# Patient Record
Sex: Female | Born: 1960 | Race: White | Hispanic: No | Marital: Married | Smoking: Never smoker
Health system: Southern US, Community
[De-identification: ages and names within clinical notes are randomized; demographics above are authoritative.]

## PROBLEM LIST (undated history)

## (undated) DIAGNOSIS — I10 Essential (primary) hypertension: Secondary | ICD-10-CM

## (undated) DIAGNOSIS — E785 Hyperlipidemia, unspecified: Secondary | ICD-10-CM

## (undated) DIAGNOSIS — F419 Anxiety disorder, unspecified: Secondary | ICD-10-CM

## (undated) DIAGNOSIS — N289 Disorder of kidney and ureter, unspecified: Secondary | ICD-10-CM

## (undated) HISTORY — PX: ENDOMETRIAL ABLATION W/ NOVASURE: SUR434

---

## 1997-11-12 ENCOUNTER — Other Ambulatory Visit: Admission: RE | Admit: 1997-11-12 | Discharge: 1997-11-12 | Payer: Self-pay | Admitting: Obstetrics and Gynecology

## 1998-02-21 ENCOUNTER — Other Ambulatory Visit: Admission: RE | Admit: 1998-02-21 | Discharge: 1998-02-21 | Payer: Self-pay | Admitting: *Deleted

## 2009-10-15 ENCOUNTER — Ambulatory Visit: Payer: Self-pay | Admitting: Advanced Practice Midwife

## 2009-10-15 ENCOUNTER — Inpatient Hospital Stay (HOSPITAL_COMMUNITY): Admission: AD | Admit: 2009-10-15 | Discharge: 2009-10-15 | Payer: Self-pay | Admitting: Obstetrics and Gynecology

## 2009-11-04 ENCOUNTER — Inpatient Hospital Stay (HOSPITAL_COMMUNITY): Admission: AD | Admit: 2009-11-04 | Discharge: 2009-11-04 | Payer: Self-pay | Admitting: Obstetrics and Gynecology

## 2009-11-04 ENCOUNTER — Ambulatory Visit: Payer: Self-pay | Admitting: Nurse Practitioner

## 2010-07-19 LAB — CBC
HCT: 33.6 % — ABNORMAL LOW (ref 36.0–46.0)
Hemoglobin: 11.4 g/dL — ABNORMAL LOW (ref 12.0–15.0)
MCH: 28 pg (ref 26.0–34.0)
MCHC: 33.4 g/dL (ref 30.0–36.0)
MCV: 87.2 fL (ref 78.0–100.0)
RBC: 3.86 MIL/uL — ABNORMAL LOW (ref 3.87–5.11)
RBC: 3.94 MIL/uL (ref 3.87–5.11)
RDW: 15.8 % — ABNORMAL HIGH (ref 11.5–15.5)
RDW: 15.7 % — ABNORMAL HIGH (ref 11.5–15.5)
WBC: 9 10*3/uL (ref 4.0–10.5)
WBC: 11.1 10*3/uL — ABNORMAL HIGH (ref 4.0–10.5)

## 2010-07-19 LAB — URINALYSIS, ROUTINE W REFLEX MICROSCOPIC
Ketones, ur: NEGATIVE mg/dL
Protein, ur: NEGATIVE mg/dL

## 2010-07-19 LAB — WET PREP, GENITAL: Trich, Wet Prep: NONE SEEN

## 2010-07-19 LAB — GC/CHLAMYDIA PROBE AMP, GENITAL: Chlamydia, DNA Probe: NEGATIVE

## 2015-01-06 ENCOUNTER — Emergency Department
Admission: EM | Admit: 2015-01-06 | Discharge: 2015-01-06 | Disposition: A | Discharge status: Home / Self Care | Payer: No Typology Code available for payment source | Attending: Emergency Medicine | Admitting: Emergency Medicine

## 2015-01-06 ENCOUNTER — Emergency Department: Payer: No Typology Code available for payment source

## 2015-01-06 DIAGNOSIS — S161XXA Strain of muscle, fascia and tendon at neck level, initial encounter: Secondary | ICD-10-CM | POA: Insufficient documentation

## 2015-01-06 DIAGNOSIS — M5030 Other cervical disc degeneration, unspecified cervical region: Secondary | ICD-10-CM | POA: Insufficient documentation

## 2015-01-06 DIAGNOSIS — Z79899 Other long term (current) drug therapy: Secondary | ICD-10-CM | POA: Diagnosis not present

## 2015-01-06 DIAGNOSIS — Y92410 Unspecified street and highway as the place of occurrence of the external cause: Secondary | ICD-10-CM | POA: Insufficient documentation

## 2015-01-06 DIAGNOSIS — M545 Low back pain, unspecified: Secondary | ICD-10-CM

## 2015-01-06 DIAGNOSIS — S3992XA Unspecified injury of lower back, initial encounter: Secondary | ICD-10-CM | POA: Diagnosis not present

## 2015-01-06 DIAGNOSIS — Y9389 Activity, other specified: Secondary | ICD-10-CM | POA: Diagnosis not present

## 2015-01-06 DIAGNOSIS — M5136 Other intervertebral disc degeneration, lumbar region: Secondary | ICD-10-CM | POA: Insufficient documentation

## 2015-01-06 DIAGNOSIS — Y998 Other external cause status: Secondary | ICD-10-CM | POA: Diagnosis not present

## 2015-01-06 DIAGNOSIS — I10 Essential (primary) hypertension: Secondary | ICD-10-CM | POA: Insufficient documentation

## 2015-01-06 HISTORY — DX: Anxiety disorder, unspecified: F41.9

## 2015-01-06 HISTORY — DX: Essential (primary) hypertension: I10

## 2015-01-06 MED ORDER — CYCLOBENZAPRINE HCL 10 MG PO TABS
5.0000 mg | ORAL_TABLET | Freq: Once | ORAL | Status: AC
  Administered 2015-01-06: 5 mg via ORAL
  Filled 2015-01-06: qty 1

## 2015-01-06 MED ORDER — CYCLOBENZAPRINE HCL 10 MG PO TABS: 10.0000 mg | ORAL_TABLET | Freq: Three times a day (TID) | ORAL | Status: DC | PRN

## 2015-01-06 MED ORDER — TRAMADOL HCL 50 MG PO TABS: 50.0000 mg | ORAL_TABLET | Freq: Once | ORAL | Status: DC

## 2015-01-06 MED ORDER — TRAMADOL HCL 50 MG PO TABS: 50.0000 mg | ORAL_TABLET | Freq: Four times a day (QID) | ORAL | Status: DC | PRN

## 2015-01-06 MED ORDER — NAPROXEN 500 MG PO TABS
500.0000 mg | ORAL_TABLET | Freq: Once | ORAL | Status: AC
  Administered 2015-01-06: 500 mg via ORAL
  Filled 2015-01-06: qty 1
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
## 2015-03-10 ENCOUNTER — Encounter: Payer: Self-pay | Admitting: Emergency Medicine

## 2015-03-10 ENCOUNTER — Emergency Department
Admission: EM | Admit: 2015-03-10 | Discharge: 2015-03-10 | Disposition: A | Discharge status: Home / Self Care | Payer: No Typology Code available for payment source | Attending: Emergency Medicine | Admitting: Emergency Medicine

## 2015-03-10 DIAGNOSIS — I10 Essential (primary) hypertension: Secondary | ICD-10-CM | POA: Diagnosis not present

## 2015-03-10 DIAGNOSIS — Z79899 Other long term (current) drug therapy: Secondary | ICD-10-CM | POA: Diagnosis not present

## 2015-03-10 DIAGNOSIS — M549 Dorsalgia, unspecified: Secondary | ICD-10-CM | POA: Insufficient documentation

## 2015-03-10 DIAGNOSIS — G8921 Chronic pain due to trauma: Secondary | ICD-10-CM | POA: Insufficient documentation

## 2015-03-10 DIAGNOSIS — M542 Cervicalgia: Secondary | ICD-10-CM | POA: Insufficient documentation

## 2015-03-10 DIAGNOSIS — G8929 Other chronic pain: Secondary | ICD-10-CM

## 2015-03-10 DIAGNOSIS — Z760 Encounter for issue of repeat prescription: Secondary | ICD-10-CM | POA: Diagnosis present

## 2015-03-10 HISTORY — DX: Disorder of kidney and ureter, unspecified: N28.9

## 2015-03-10 MED ORDER — PREDNISONE 10 MG PO TABS: 10.0000 mg | ORAL_TABLET | Freq: Two times a day (BID) | ORAL | Status: DC

## 2015-03-10 MED ORDER — TRAMADOL HCL 50 MG PO TABS: 50.0000 mg | ORAL_TABLET | Freq: Two times a day (BID) | ORAL | Status: DC
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
## 2015-03-10 NOTE — ED Notes (Signed)
Pt presents requesting refill on hydrocodone/acetaminophen 5-325 mg. Last filled 02/07/15. Quantity 60.
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
## 2015-03-10 NOTE — Discharge Instructions (Signed)
Chronic Back Pain  When back pain lasts longer than 3 months, it is called chronic back pain.People with chronic back pain often go through certain periods that are more intense (flare-ups).  CAUSES Chronic back pain can be caused by wear and tear (degeneration) on different structures in your back. These structures include:  The bones of your spine (vertebrae) and the joints surrounding your spinal cord and nerve roots (facets).  The strong, fibrous tissues that connect your vertebrae (ligaments). Degeneration of these structures may result in pressure on your nerves. This can lead to constant pain. HOME CARE INSTRUCTIONS  Avoid bending, heavy lifting, prolonged sitting, and activities which make the problem worse.  Take brief periods of rest throughout the day to reduce your pain. Lying down or standing usually is better than sitting while you are resting.  Take over-the-counter or prescription medicines only as directed by your caregiver. SEEK IMMEDIATE MEDICAL CARE IF:   You have weakness or numbness in one of your legs or feet.  You have trouble controlling your bladder or bowels.  You have nausea, vomiting, abdominal pain, shortness of breath, or fainting.   This information is not intended to replace advice given to you by your health care provider. Make sure you discuss any questions you have with your health care provider.   Document Released: 05/27/2004 Document Revised: 07/12/2011 Document Reviewed: 10/07/2014 Elsevier Interactive Patient Education [employer].  Emergency care providers appreciate that many patients coming to [place] are in severe pain and we wish to address their pain in the safest, most responsible manner.  It is important to recognize, however, that the proper treatment of chronic pain differs from that of the pain of injuries and acute illnesses.  Our goal is to provider quality, safe, personalized care and we thank you for giving [place] the opportunity to  serve you.  The use of narcotics and related agents for chronic pain syndromes may lead to additional physical and psychological problems.  Nearly as many people die from prescription narcotics each year as die from car crashes.  Additionally, this risk is increased if such prescriptions are obtained from a variety of sources.  Therefore, only your primary care physician or a pain management specialist is able to safely treat such syndromes with narcotic medications long-term.  Documentation revealing such prescriptions have been sought from multiple sources may prohibit [place] from providing a refill or different narcotic medication.  Your name may be checked first through the [place].  This database is a record of controlled substance medication prescriptions that the patient has received.  This has been established by [facility] in an effort to eliminate the dangerous, and often life-threatening, practice of obtaining multiple prescriptions from different medical providers.  If you have a chronic pain syndrome (i.e. chronic headaches, recurrent back or neck pain, dental pain, abdominal or pelvic pain without a specific diagnosis, or neuropathic pain such as fibromyalgia) or recurrent visits for the same condition without an acute diagnosis, you may be treated with non-narcotics and other non-addictive medicines.  Allergic reactions or negative side effects that may be reported by a patient to such medications will not typically lead to the use of a narcotic analgesic or other controlled substance as an alternative.  Patients managing chronic pain with a personal physician should have provisions in place for breakthrough pain.  If you are in crisis, you should call your physician.  If your physician directs you to the emergency department,  please have the doctor call and speak to our attending physician concerning your care.  When patients come to the  Emergency Department (ED) with acute medical conditions in which the ED physician feels it is appropriate to prescribe narcotic or sedating pain medication, the physician will prescribe these in very limited quantities.  The amount of these medications will last only until you can see your primary care physician in his/her office.  Any patient who returns to the ED seeking refills should expect only non-narcotic pain medications.  In the event an acute medical condition exists and the emergency physician feels it is necessary that the patient be given a narcotic or sedating medication, a responsible adult driver should be present in the room prior to the medication being given by the nurse.  Prescriptions for narcotic or sedating medications that have been lost, stolen, or expired will NOT be refilled in the ED.  Patients who have chronic pain may receive non-narcotic prescriptions until seen by their primary care physician.  It is every patient's personal responsibility to maintain active prescriptions with his or her primary care physician or specialist.  ################################################################################################  You must follow-up with your primary providers for management of your chronic pain. Take the prescription meds as directed.

## 2020-06-18 ENCOUNTER — Other Ambulatory Visit: Payer: Self-pay

## 2020-06-18 ENCOUNTER — Ambulatory Visit (INDEPENDENT_AMBULATORY_CARE_PROVIDER_SITE_OTHER): Payer: 59

## 2020-06-18 ENCOUNTER — Ambulatory Visit
Admission: EM | Admit: 2020-06-18 | Discharge: 2020-06-18 | Disposition: A | Discharge status: Home / Self Care | Payer: 59

## 2020-06-18 DIAGNOSIS — M7021 Olecranon bursitis, right elbow: Secondary | ICD-10-CM

## 2020-06-18 NOTE — Discharge Instructions (Addendum)
Your x-rays today did not show the presence of any broken bones in your elbow.  Apply an Ace bandage or an elastic compression sleeve to help your body reabsorb the fluid in your elbow.  You can also apply topical Voltaren gel up to 4 times a day as needed for any discomfort.  Return for reevaluation for any new or worsening symptoms.

## 2020-06-18 NOTE — ED Provider Notes (Signed)
[facility]    CSN: [id number] Arrival date & time: 06/18/20  1608      History   Chief Complaint Chief Complaint  Patient presents with  . Elbow Pain    right    HPI Katelyn Gardner is a 60 y.o. female.   HPI   60 year old female here for evaluation of pain and swelling over her right elbow.  Patient reports that she fell about 8 days ago going up some steps.  She reports that she has had swelling right over the point of her elbow and states it feels like there is some fluid buildup.  Patient denies numbness, tingling, or weakness in her right hand and she has full range of motion of her elbow.  Past Medical History:  Diagnosis Date  . Anxiety   . Hypertension   . Renal disorder     There are no problems to display for this patient.   History reviewed. No pertinent surgical history.  OB History   No obstetric history on file.      Home Medications    Prior to Admission medications   Medication Sig Start Date End Date Taking? Authorizing Provider  atorvastatin (LIPITOR) 10 MG tablet Take 10 mg by mouth daily. 05/29/20  Yes [provider]  carvedilol (COREG) 3.125 MG tablet Take by mouth. 06/17/20  Yes [provider]  clonazePAM (KLONOPIN) 0.5 MG tablet Take by mouth. 06/18/20  Yes [provider]  cyclobenzaprine (FLEXERIL) 10 MG tablet Take 1 tablet (10 mg total) by mouth 3 (three) times daily as needed for muscle spasms. 01/06/15  Yes Triplett, Cari B, FNP  enalapril (VASOTEC) 5 MG tablet Take 5 mg by mouth daily. 05/06/20  Yes [provider]  HYDROcodone-acetaminophen (NORCO/VICODIN) 5-325 MG tablet Take by mouth. 06/03/20  Yes [provider]  propranolol (INDERAL) 10 MG tablet Take 10 mg by mouth 2 (two) times daily. 06/02/20  Yes [provider]  zolpidem (AMBIEN) 10 MG tablet Take 10 mg by mouth at bedtime as needed. 06/02/20  Yes [provider]  hydrochlorothiazide (MICROZIDE) 12.5 MG  capsule Take 12.5 mg by mouth daily.  06/18/20  [provider]    Family History History reviewed. No pertinent family history.  Social History Social History   Tobacco Use  . Smoking status: Never Smoker  . Smokeless tobacco: Never Used  Substance Use Topics  . Alcohol use: No  . Drug use: No     Allergies   Sulfa antibiotics   Review of Systems Review of Systems  Constitutional: Negative for activity change.  Musculoskeletal: Positive for arthralgias and joint swelling.  Skin: Negative for color change and rash.  Neurological: Negative for weakness and numbness.  Hematological: Negative.   Psychiatric/Behavioral: Negative.      Physical Exam Triage Vital Signs ED Triage Vitals  Enc Vitals Group     BP 06/18/20 1638 (!) 161/118     Pulse Rate 06/18/20 1638 (!) 109     Resp 06/18/20 1638 18     Temp 06/18/20 1638 98.5 F (36.9 C)     Temp Source 06/18/20 1638 Oral     SpO2 06/18/20 1638 100 %     Weight 06/18/20 1634 135 lb (61.2 kg)     Height 06/18/20 1634 5' (1.524 m)     Head Circumference --      Peak Flow --      Pain Score 06/18/20 1634 6     Pain Loc --  Pain Edu? --      Excl. in GC? --    No data found.  Updated Vital Signs BP (!) 161/118 (BP Location: Left Arm)   Pulse (!) 109   Temp 98.5 F (36.9 C) (Oral)   Resp 18   Ht 5' (1.524 m)   Wt 135 lb (61.2 kg)   SpO2 100%   BMI 26.37 kg/m   Visual Acuity Right Eye Distance:   Left Eye Distance:   Bilateral Distance:    Right Eye Near:   Left Eye Near:    Bilateral Near:     Physical Exam Vitals and nursing note reviewed.  Constitutional:      General: She is not in acute distress.    Appearance: Normal appearance. She is normal weight. She is not ill-appearing.  HENT:     Head: Normocephalic and atraumatic.  Cardiovascular:     Rate and Rhythm: Normal rate and regular rhythm.     Pulses: Normal pulses.     Heart sounds: Normal heart sounds. No murmur heard. No  gallop.   Pulmonary:     Effort: Pulmonary effort is normal.     Breath sounds: Normal breath sounds. No wheezing, rhonchi or rales.  Musculoskeletal:        General: Swelling and tenderness present. No deformity or signs of injury. Normal range of motion.  Skin:    General: Skin is warm and dry.     Capillary Refill: Capillary refill takes less than 2 seconds.     Findings: No bruising or erythema.  Neurological:     General: No focal deficit present.     Mental Status: She is alert and oriented to person, place, and time.  Psychiatric:        Mood and Affect: Mood normal.        Behavior: Behavior normal.        Thought Content: Thought content normal.        Judgment: Judgment normal.      UC Treatments / Results  Labs (all labs ordered are listed, but only abnormal results are displayed) Labs Reviewed - No data to display  EKG   Radiology DG Elbow Complete Right  Result Date: 06/18/2020 CLINICAL DATA:  Pain and swelling over the olecranon status post fall. EXAM: RIGHT ELBOW - COMPLETE 3+ VIEW COMPARISON:  None. FINDINGS: There is soft tissue swelling about the proximal ulna without evidence for an underlying acute displaced fracture or dislocation. There is no significant joint effusion. There is a tiny well corticated osseous fragment adjacent to the radial head which is likely related to an old remote injury. IMPRESSION: Soft tissue swelling about the proximal ulna without evidence for an underlying acute displaced fracture or dislocation. Electronically Signed   By: Katherine Mantle M.D.   On: 06/18/2020 17:19    Procedures Procedures (including critical care time)  Medications Ordered in UC Medications - No data to display  Initial Impression / Assessment and Plan / UC Course  I have reviewed the triage vital signs and the nursing notes.  Pertinent labs & imaging results that were available during my care of the patient were reviewed by me and considered in my  medical decision making (see chart for details).   Patient is a very pleasant 60 year old female here for evaluation of pain and swelling of her right elbow.  Physical exam reveals swelling directly over the olecranon process.  The swelling is not tender, erythematous, or hot.  Patient does  have tenderness to palpation of the tip of the olecranon process.  There is no ecchymosis present and patient has full range of motion of her right elbow.  No pain with supination or pronation of the right forearm.  Radial and ulnar pulses are 2+.  Cap refill less than 2 seconds.  Full sensation of the fingers and hand present.  Will obtain radiograph of right elbow to rule out fracture of olecranon process.  Suspect patient has a rupture of her olecranon bursa.  X-ray shows no fracture or dislocation per radiology interpretation.   Patient home with a diagnosis of olecranon bursitis and have her apply compression using Ace bandage or a sleeve, and take over-the-counter NSAID as needed for inflammation.  Final Clinical Impressions(s) / UC Diagnoses   Final diagnoses:  Olecranon bursitis of right elbow     Discharge Instructions     Your x-rays today did not show the presence of any broken bones in your elbow.  Apply an Ace bandage or an elastic compression sleeve to help your body reabsorb the fluid in your elbow.  You can also apply topical Voltaren gel up to 4 times a day as needed for any discomfort.  Return for reevaluation for any new or worsening symptoms.    ED Prescriptions    None     PDMP not reviewed this encounter.   Becky Augusta, NP 06/18/20 [phone]

## 2020-06-18 NOTE — ED Triage Notes (Signed)
Patient complains of right elbow pain and swelling. Patient states that she fell around 8 days ago bringing in groceries and fell going up steps. Patient with swelling to right elbow and states that she feels like there is a fluid build up.

## 2021-05-13 DIAGNOSIS — R69 Illness, unspecified: Secondary | ICD-10-CM | POA: Diagnosis not present

## 2021-05-13 DIAGNOSIS — F3342 Major depressive disorder, recurrent, in full remission: Secondary | ICD-10-CM | POA: Diagnosis not present

## 2021-05-13 DIAGNOSIS — F4001 Agoraphobia with panic disorder: Secondary | ICD-10-CM | POA: Diagnosis not present

## 2021-06-10 DIAGNOSIS — F410 Panic disorder [episodic paroxysmal anxiety] without agoraphobia: Secondary | ICD-10-CM | POA: Diagnosis not present

## 2021-06-10 DIAGNOSIS — R69 Illness, unspecified: Secondary | ICD-10-CM | POA: Diagnosis not present

## 2021-06-10 DIAGNOSIS — F411 Generalized anxiety disorder: Secondary | ICD-10-CM | POA: Diagnosis not present

## 2021-07-08 DIAGNOSIS — F410 Panic disorder [episodic paroxysmal anxiety] without agoraphobia: Secondary | ICD-10-CM | POA: Diagnosis not present

## 2021-07-08 DIAGNOSIS — R69 Illness, unspecified: Secondary | ICD-10-CM | POA: Diagnosis not present

## 2021-07-08 DIAGNOSIS — F411 Generalized anxiety disorder: Secondary | ICD-10-CM | POA: Diagnosis not present

## 2021-07-30 DIAGNOSIS — N1831 Chronic kidney disease, stage 3a: Secondary | ICD-10-CM | POA: Diagnosis not present

## 2021-07-30 DIAGNOSIS — R809 Proteinuria, unspecified: Secondary | ICD-10-CM | POA: Diagnosis not present

## 2021-07-30 DIAGNOSIS — N183 Chronic kidney disease, stage 3 unspecified: Secondary | ICD-10-CM | POA: Diagnosis not present

## 2021-07-30 DIAGNOSIS — I10 Essential (primary) hypertension: Secondary | ICD-10-CM | POA: Diagnosis not present

## 2021-08-06 DIAGNOSIS — F3342 Major depressive disorder, recurrent, in full remission: Secondary | ICD-10-CM | POA: Diagnosis not present

## 2021-08-06 DIAGNOSIS — F411 Generalized anxiety disorder: Secondary | ICD-10-CM | POA: Diagnosis not present

## 2021-08-06 DIAGNOSIS — R69 Illness, unspecified: Secondary | ICD-10-CM | POA: Diagnosis not present

## 2021-08-25 DIAGNOSIS — R69 Illness, unspecified: Secondary | ICD-10-CM | POA: Diagnosis not present

## 2021-08-25 DIAGNOSIS — Z79891 Long term (current) use of opiate analgesic: Secondary | ICD-10-CM | POA: Diagnosis not present

## 2021-08-25 DIAGNOSIS — E782 Mixed hyperlipidemia: Secondary | ICD-10-CM | POA: Diagnosis not present

## 2021-08-25 DIAGNOSIS — N1831 Chronic kidney disease, stage 3a: Secondary | ICD-10-CM | POA: Diagnosis not present

## 2021-08-25 DIAGNOSIS — M545 Low back pain, unspecified: Secondary | ICD-10-CM | POA: Diagnosis not present

## 2021-08-25 DIAGNOSIS — G8929 Other chronic pain: Secondary | ICD-10-CM | POA: Diagnosis not present

## 2021-08-25 DIAGNOSIS — I10 Essential (primary) hypertension: Secondary | ICD-10-CM | POA: Diagnosis not present

## 2021-08-25 DIAGNOSIS — Z79899 Other long term (current) drug therapy: Secondary | ICD-10-CM | POA: Diagnosis not present

## 2021-09-02 DIAGNOSIS — F4001 Agoraphobia with panic disorder: Secondary | ICD-10-CM | POA: Diagnosis not present

## 2021-09-02 DIAGNOSIS — R69 Illness, unspecified: Secondary | ICD-10-CM | POA: Diagnosis not present

## 2021-09-02 DIAGNOSIS — F411 Generalized anxiety disorder: Secondary | ICD-10-CM | POA: Diagnosis not present

## 2021-09-30 DIAGNOSIS — F411 Generalized anxiety disorder: Secondary | ICD-10-CM | POA: Diagnosis not present

## 2021-09-30 DIAGNOSIS — F3342 Major depressive disorder, recurrent, in full remission: Secondary | ICD-10-CM | POA: Diagnosis not present

## 2021-09-30 DIAGNOSIS — R69 Illness, unspecified: Secondary | ICD-10-CM | POA: Diagnosis not present

## 2021-10-28 DIAGNOSIS — R69 Illness, unspecified: Secondary | ICD-10-CM | POA: Diagnosis not present

## 2021-10-28 DIAGNOSIS — F411 Generalized anxiety disorder: Secondary | ICD-10-CM | POA: Diagnosis not present

## 2021-10-28 DIAGNOSIS — F4001 Agoraphobia with panic disorder: Secondary | ICD-10-CM | POA: Diagnosis not present

## 2021-11-25 DIAGNOSIS — R69 Illness, unspecified: Secondary | ICD-10-CM | POA: Diagnosis not present

## 2021-11-25 DIAGNOSIS — F3341 Major depressive disorder, recurrent, in partial remission: Secondary | ICD-10-CM | POA: Diagnosis not present

## 2021-11-25 DIAGNOSIS — F4001 Agoraphobia with panic disorder: Secondary | ICD-10-CM | POA: Diagnosis not present

## 2021-12-22 DIAGNOSIS — N183 Chronic kidney disease, stage 3 unspecified: Secondary | ICD-10-CM | POA: Diagnosis not present

## 2021-12-22 DIAGNOSIS — R809 Proteinuria, unspecified: Secondary | ICD-10-CM | POA: Diagnosis not present

## 2021-12-22 DIAGNOSIS — N1831 Chronic kidney disease, stage 3a: Secondary | ICD-10-CM | POA: Diagnosis not present

## 2021-12-22 DIAGNOSIS — I10 Essential (primary) hypertension: Secondary | ICD-10-CM | POA: Diagnosis not present

## 2022-01-19 DIAGNOSIS — F330 Major depressive disorder, recurrent, mild: Secondary | ICD-10-CM | POA: Diagnosis not present

## 2022-01-19 DIAGNOSIS — R69 Illness, unspecified: Secondary | ICD-10-CM | POA: Diagnosis not present

## 2022-01-19 DIAGNOSIS — F4001 Agoraphobia with panic disorder: Secondary | ICD-10-CM | POA: Diagnosis not present

## 2022-03-16 DIAGNOSIS — F411 Generalized anxiety disorder: Secondary | ICD-10-CM | POA: Diagnosis not present

## 2022-03-16 DIAGNOSIS — R69 Illness, unspecified: Secondary | ICD-10-CM | POA: Diagnosis not present

## 2022-03-16 DIAGNOSIS — F4001 Agoraphobia with panic disorder: Secondary | ICD-10-CM | POA: Diagnosis not present

## 2022-03-31 DIAGNOSIS — E782 Mixed hyperlipidemia: Secondary | ICD-10-CM | POA: Diagnosis not present

## 2022-03-31 DIAGNOSIS — Z79899 Other long term (current) drug therapy: Secondary | ICD-10-CM | POA: Diagnosis not present

## 2022-03-31 DIAGNOSIS — R69 Illness, unspecified: Secondary | ICD-10-CM | POA: Diagnosis not present

## 2022-03-31 DIAGNOSIS — M545 Low back pain, unspecified: Secondary | ICD-10-CM | POA: Diagnosis not present

## 2022-03-31 DIAGNOSIS — N1831 Chronic kidney disease, stage 3a: Secondary | ICD-10-CM | POA: Diagnosis not present

## 2022-03-31 DIAGNOSIS — G8929 Other chronic pain: Secondary | ICD-10-CM | POA: Diagnosis not present

## 2022-03-31 DIAGNOSIS — Z79891 Long term (current) use of opiate analgesic: Secondary | ICD-10-CM | POA: Diagnosis not present

## 2022-03-31 DIAGNOSIS — I10 Essential (primary) hypertension: Secondary | ICD-10-CM | POA: Diagnosis not present

## 2022-04-13 DIAGNOSIS — R69 Illness, unspecified: Secondary | ICD-10-CM | POA: Diagnosis not present

## 2022-04-13 DIAGNOSIS — F411 Generalized anxiety disorder: Secondary | ICD-10-CM | POA: Diagnosis not present

## 2022-04-13 DIAGNOSIS — F3342 Major depressive disorder, recurrent, in full remission: Secondary | ICD-10-CM | POA: Diagnosis not present

## 2022-04-21 DIAGNOSIS — R809 Proteinuria, unspecified: Secondary | ICD-10-CM | POA: Diagnosis not present

## 2022-04-21 DIAGNOSIS — I10 Essential (primary) hypertension: Secondary | ICD-10-CM | POA: Diagnosis not present

## 2022-04-21 DIAGNOSIS — N1831 Chronic kidney disease, stage 3a: Secondary | ICD-10-CM | POA: Diagnosis not present

## 2022-07-19 DIAGNOSIS — M170 Bilateral primary osteoarthritis of knee: Secondary | ICD-10-CM | POA: Diagnosis not present

## 2022-07-27 DIAGNOSIS — F3342 Major depressive disorder, recurrent, in full remission: Secondary | ICD-10-CM | POA: Diagnosis not present

## 2022-07-27 DIAGNOSIS — F4001 Agoraphobia with panic disorder: Secondary | ICD-10-CM | POA: Diagnosis not present

## 2022-07-27 DIAGNOSIS — F411 Generalized anxiety disorder: Secondary | ICD-10-CM | POA: Diagnosis not present

## 2022-09-21 DIAGNOSIS — F411 Generalized anxiety disorder: Secondary | ICD-10-CM | POA: Diagnosis not present

## 2022-09-21 DIAGNOSIS — F4001 Agoraphobia with panic disorder: Secondary | ICD-10-CM | POA: Diagnosis not present

## 2022-09-21 DIAGNOSIS — F3342 Major depressive disorder, recurrent, in full remission: Secondary | ICD-10-CM | POA: Diagnosis not present

## 2022-10-04 DIAGNOSIS — I10 Essential (primary) hypertension: Secondary | ICD-10-CM | POA: Diagnosis not present

## 2022-10-04 DIAGNOSIS — R809 Proteinuria, unspecified: Secondary | ICD-10-CM | POA: Diagnosis not present

## 2022-10-04 DIAGNOSIS — N1831 Chronic kidney disease, stage 3a: Secondary | ICD-10-CM | POA: Diagnosis not present

## 2022-10-04 IMAGING — CR DG ELBOW COMPLETE 3+V*R*
4 series · 4 of 4 positions shown · non-contrast
Comparison: None.

CLINICAL DATA: Pain and swelling over the olecranon status post
fall.

EXAM:
RIGHT ELBOW - COMPLETE 3+ VIEW

[elbow ap]
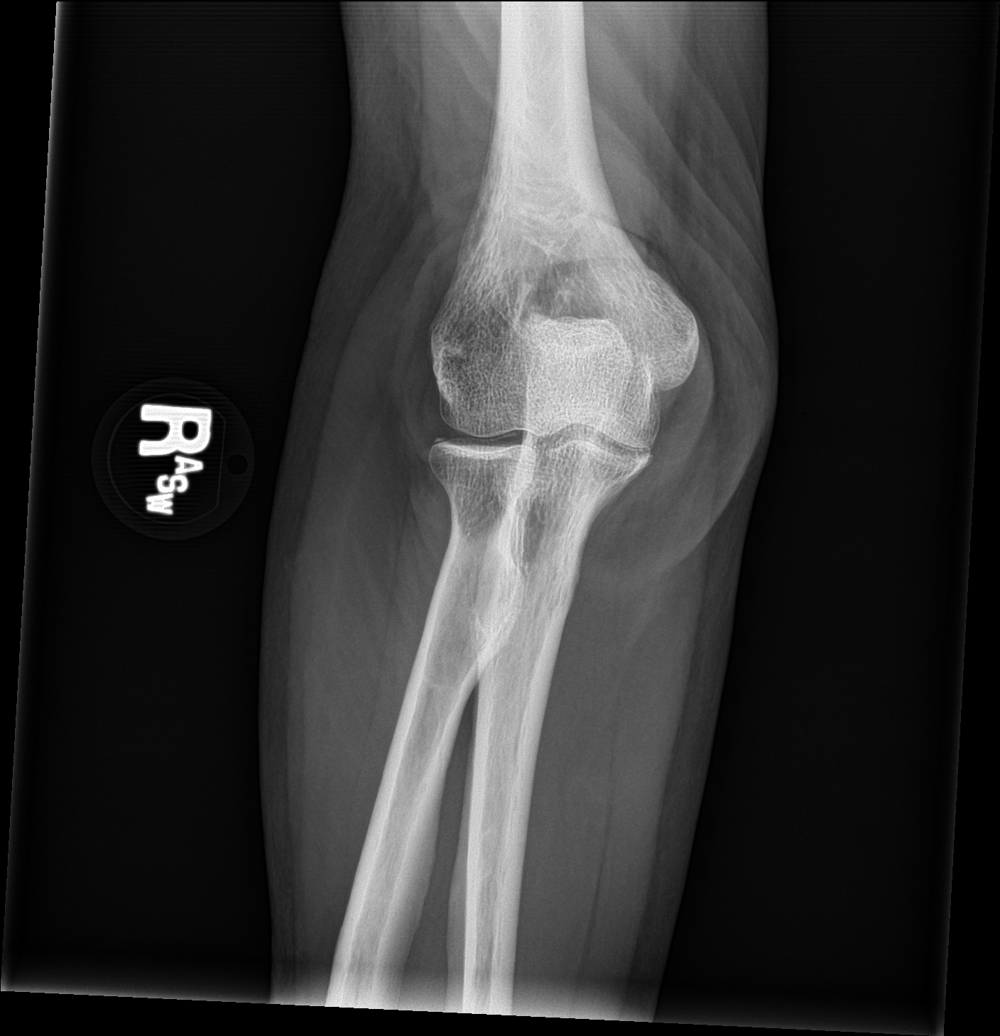

[elbow obl (1 of 2)]
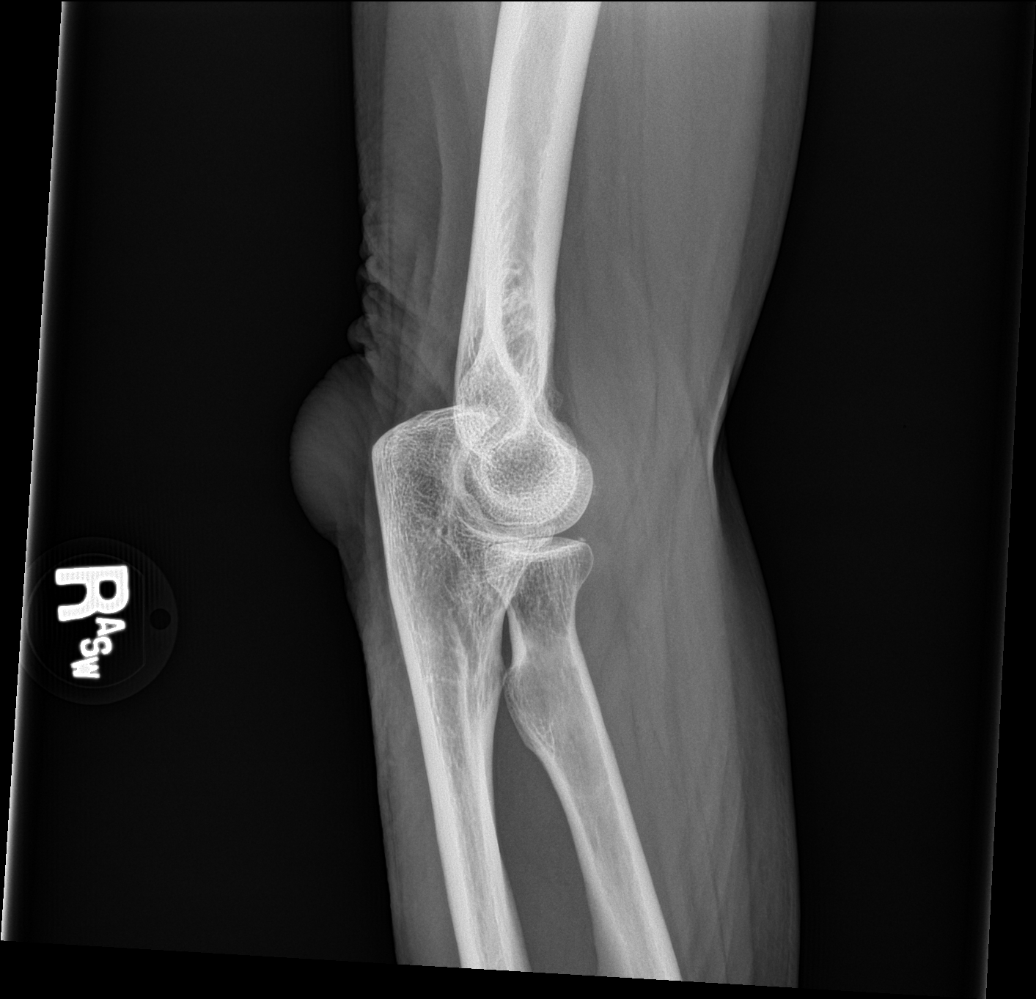

[elbow obl (2 of 2)]
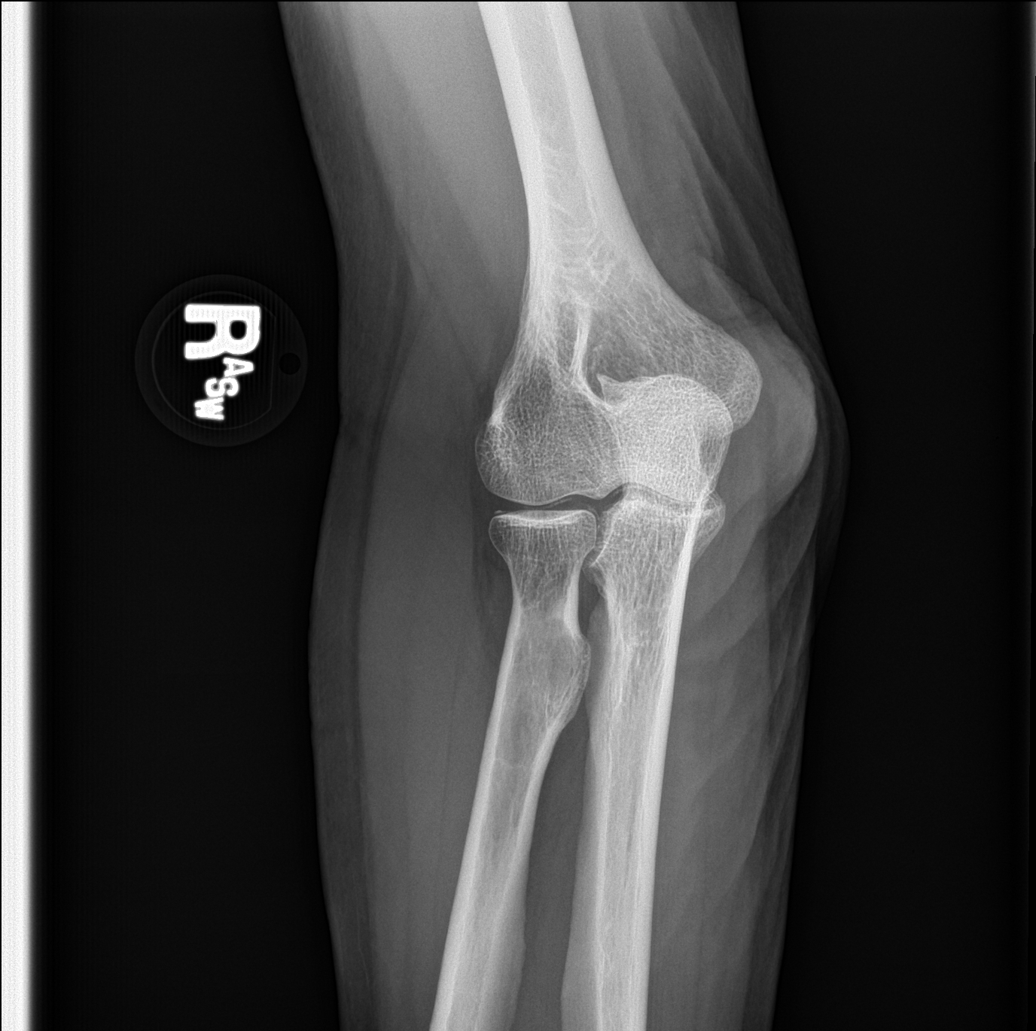

[elbow lat]
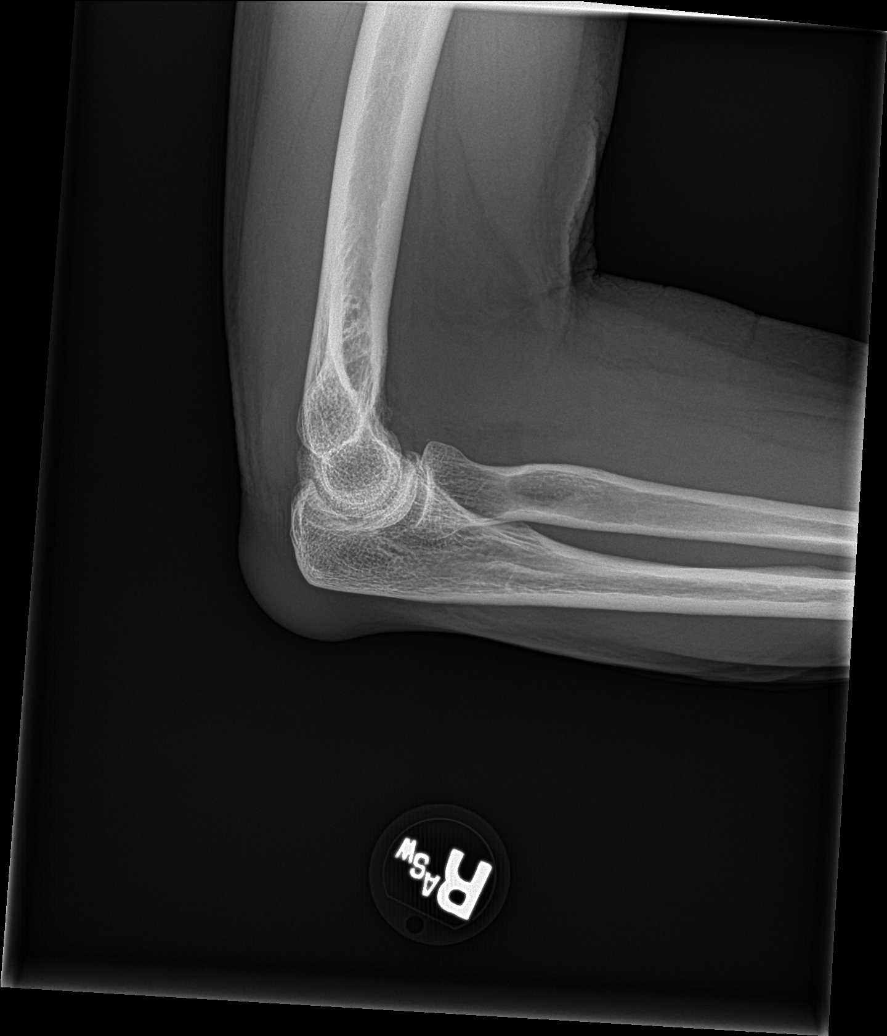

[4 of 4 positions shown; findings below may reference images not displayed]

FINDINGS: There is soft tissue swelling about the proximal ulna without
evidence for an underlying acute displaced fracture or dislocation.
There is no significant joint effusion. There is a tiny well
corticated osseous fragment adjacent to the radial head which is
likely related to an old remote injury.
IMPRESSION: Soft tissue swelling about the proximal ulna without evidence for an
underlying acute displaced fracture or dislocation.

## 2022-10-06 DIAGNOSIS — Z79899 Other long term (current) drug therapy: Secondary | ICD-10-CM | POA: Diagnosis not present

## 2022-10-06 DIAGNOSIS — Z1331 Encounter for screening for depression: Secondary | ICD-10-CM | POA: Diagnosis not present

## 2022-10-06 DIAGNOSIS — I10 Essential (primary) hypertension: Secondary | ICD-10-CM | POA: Diagnosis not present

## 2022-10-06 DIAGNOSIS — Z79891 Long term (current) use of opiate analgesic: Secondary | ICD-10-CM | POA: Diagnosis not present

## 2022-10-06 DIAGNOSIS — Z131 Encounter for screening for diabetes mellitus: Secondary | ICD-10-CM | POA: Diagnosis not present

## 2022-10-06 DIAGNOSIS — F5104 Psychophysiologic insomnia: Secondary | ICD-10-CM | POA: Diagnosis not present

## 2022-10-06 DIAGNOSIS — G8929 Other chronic pain: Secondary | ICD-10-CM | POA: Diagnosis not present

## 2022-10-06 DIAGNOSIS — E782 Mixed hyperlipidemia: Secondary | ICD-10-CM | POA: Diagnosis not present

## 2022-10-06 DIAGNOSIS — F419 Anxiety disorder, unspecified: Secondary | ICD-10-CM | POA: Diagnosis not present

## 2022-10-06 DIAGNOSIS — Z0000 Encounter for general adult medical examination without abnormal findings: Secondary | ICD-10-CM | POA: Diagnosis not present

## 2022-10-06 DIAGNOSIS — N1831 Chronic kidney disease, stage 3a: Secondary | ICD-10-CM | POA: Diagnosis not present

## 2022-10-06 DIAGNOSIS — R7302 Impaired glucose tolerance (oral): Secondary | ICD-10-CM

## 2022-10-06 DIAGNOSIS — M545 Low back pain, unspecified: Secondary | ICD-10-CM | POA: Diagnosis not present

## 2022-10-06 HISTORY — DX: Impaired glucose tolerance (oral): R73.02

## 2022-10-19 DIAGNOSIS — F3342 Major depressive disorder, recurrent, in full remission: Secondary | ICD-10-CM | POA: Diagnosis not present

## 2022-10-19 DIAGNOSIS — F4001 Agoraphobia with panic disorder: Secondary | ICD-10-CM | POA: Diagnosis not present

## 2022-10-19 DIAGNOSIS — F411 Generalized anxiety disorder: Secondary | ICD-10-CM | POA: Diagnosis not present

## 2022-10-29 ENCOUNTER — Inpatient Hospital Stay: Payer: 59 | Attending: Internal Medicine | Admitting: Internal Medicine

## 2022-10-29 ENCOUNTER — Encounter: Payer: Self-pay | Admitting: Internal Medicine

## 2022-10-29 ENCOUNTER — Inpatient Hospital Stay: Payer: 59

## 2022-10-29 VITALS — BP 142/92 | HR 87 | Temp 97.8°F | Wt 140.0 lb

## 2022-10-29 DIAGNOSIS — I129 Hypertensive chronic kidney disease with stage 1 through stage 4 chronic kidney disease, or unspecified chronic kidney disease: Secondary | ICD-10-CM | POA: Diagnosis not present

## 2022-10-29 DIAGNOSIS — F419 Anxiety disorder, unspecified: Secondary | ICD-10-CM

## 2022-10-29 DIAGNOSIS — Z79899 Other long term (current) drug therapy: Secondary | ICD-10-CM

## 2022-10-29 DIAGNOSIS — I10 Essential (primary) hypertension: Secondary | ICD-10-CM

## 2022-10-29 DIAGNOSIS — D649 Anemia, unspecified: Secondary | ICD-10-CM | POA: Diagnosis not present

## 2022-10-29 DIAGNOSIS — N1831 Chronic kidney disease, stage 3a: Secondary | ICD-10-CM

## 2022-10-29 LAB — IRON AND TIBC
Iron: 56 ug/dL (ref 28–170)
Saturation Ratios: 16 % (ref 10.4–31.8)
TIBC: 351 ug/dL (ref 250–450)
UIBC: 295 ug/dL

## 2022-10-29 LAB — CBC WITH DIFFERENTIAL/PLATELET
Abs Immature Granulocytes: 0.02 10*3/uL (ref 0.00–0.07)
Basophils Absolute: 0 10*3/uL (ref 0.0–0.1)
Basophils Relative: 1 %
Eosinophils Absolute: 0.1 10*3/uL (ref 0.0–0.5)
Eosinophils Relative: 2 %
HCT: 32.3 % — ABNORMAL LOW (ref 36.0–46.0)
Hemoglobin: 10.8 g/dL — ABNORMAL LOW (ref 12.0–15.0)
Immature Granulocytes: 0 %
Lymphocytes Relative: 28 %
Lymphs Abs: 1.7 10*3/uL (ref 0.7–4.0)
MCH: 30.9 pg (ref 26.0–34.0)
MCHC: 33.4 g/dL (ref 30.0–36.0)
MCV: 92.6 fL (ref 80.0–100.0)
Monocytes Absolute: 0.4 10*3/uL (ref 0.1–1.0)
Monocytes Relative: 6 %
Neutro Abs: 3.9 10*3/uL (ref 1.7–7.7)
Neutrophils Relative %: 63 %
Platelets: 348 10*3/uL (ref 150–400)
RBC: 3.49 MIL/uL — ABNORMAL LOW (ref 3.87–5.11)
RDW: 13.5 % (ref 11.5–15.5)
WBC: 6.2 10*3/uL (ref 4.0–10.5)
nRBC: 0 % (ref 0.0–0.2)

## 2022-10-29 LAB — FERRITIN: Ferritin: 16 ng/mL (ref 11–307)

## 2022-10-29 LAB — VITAMIN B12: Vitamin B-12: >7500 pg/mL — ABNORMAL HIGH (ref 180–914)

## 2022-10-29 LAB — FOLATE: Folate: 9.7 ng/mL (ref >5.9)

## 2022-10-29 NOTE — Progress Notes (Signed)
[facility]  Telephone:(336) [phone] Fax:(336) [phone]  ID: Katelyn Gardner OB: 1960-05-23  MR#: [id number]  AOZ#:[id number]  Patient Care Team: Patient, No Pcp Per as PCP - General (General Practice)  REFERRING PROVIDER: Dr. Cherylann Ratel  REASON FOR REFERRAL: Anemia, CKD  HPI: Katelyn Gardner is a 62 y.o. female with past medical history of anxiety, hypertension,    REVIEW OF SYSTEMS:   ROS  As per HPI. Otherwise, a complete review of systems is negative.  PAST MEDICAL HISTORY: Past Medical History:  Diagnosis Date  . Anxiety   . Hypertension   . Renal disorder     PAST SURGICAL HISTORY: No past surgical history on file.  FAMILY HISTORY: Family History  Problem Relation Age of Onset  . Lung cancer Father   . Lung cancer Brother     HEALTH MAINTENANCE: Social History   Tobacco Use  . Smoking status: Never  . Smokeless tobacco: Never  Substance Use Topics  . Alcohol use: No  . Drug use: No     Allergies  Allergen Reactions  . Sulfa Antibiotics Rash    Current Outpatient Medications  Medication Sig Dispense Refill  . atorvastatin (LIPITOR) 10 MG tablet Take 10 mg by mouth daily.    . butalbital-aspirin-caffeine-codeine (FIORINAL WITH CODEINE) 50-325-40-30 MG capsule Take 1 capsule by mouth every 4 (four) hours as needed for migraine. As needed    . cetirizine-pseudoephedrine (ZYRTEC-D) 5-120 MG tablet Take 1 tablet by mouth every morning.    . clonazePAM (KLONOPIN) 0.5 MG tablet Take 0.5 mg by mouth 3 (three) times daily.    . cyclobenzaprine (FLEXERIL) 10 MG tablet Take 1 tablet (10 mg total) by mouth 3 (three) times daily as needed for muscle spasms. 30 tablet 0  . enalapril (VASOTEC) 5 MG tablet Take 5 mg by mouth daily.    Marland Kitchen FLUoxetine (PROZAC) 40 MG capsule Take 40 mg by mouth daily.    Marland Kitchen HYDROcodone-acetaminophen (NORCO/VICODIN) 5-325 MG tablet Take by mouth.    . propranolol (INDERAL) 10 MG tablet Take 10 mg by mouth daily.     Marland Kitchen zolpidem (AMBIEN) 10 MG tablet Take 10 mg by mouth at bedtime as needed.    . carvedilol (COREG) 3.125 MG tablet Take by mouth. (Patient not taking: Reported on 10/29/2022)     No current facility-administered medications for this visit.    OBJECTIVE: Vitals:   10/29/22 1418 10/29/22 1428  BP: (!) 131/102 (!) 142/92  Pulse: 87   Temp: 97.8 F (36.6 C)   SpO2: 100%      Body mass index is 27.34 kg/m.      General: Well-developed, well-nourished, no acute distress. Eyes: Pink conjunctiva, anicteric sclera. HEENT: Normocephalic, moist mucous membranes, clear oropharnyx. Lungs: Clear to auscultation bilaterally. Heart: Regular rate and rhythm. No rubs, murmurs, or gallops. Abdomen: Soft, nontender, nondistended. No organomegaly noted, normoactive bowel sounds. Musculoskeletal: No edema, cyanosis, or clubbing. Neuro: Alert, answering all questions appropriately. Cranial nerves grossly intact. Skin: No rashes or petechiae noted. Psych: Normal affect. Lymphatics: No cervical, calvicular, axillary or inguinal LAD.   LAB RESULTS:  No results found for: "NA", "K", "CL", "CO2", "GLUCOSE", "BUN", "CREATININE", "CALCIUM", "PROT", "ALBUMIN", "AST", "ALT", "ALKPHOS", "BILITOT", "GFRNONAA", "GFRAA"  Lab Results  Component Value Date   WBC 6.2 10/29/2022   NEUTROABS 3.9 10/29/2022   HGB 10.8 (L) 10/29/2022   HCT 32.3 (L) 10/29/2022   MCV 92.6 10/29/2022   PLT 348 10/29/2022    No results found for: "  TIBC", "FERRITIN", "IRONPCTSAT"   STUDIES: No results found.  ASSESSMENT AND PLAN:   Katelyn Gardner is a 62 y.o. female with pmh of   Patient expressed understanding and was in agreement with this plan. She also understands that She can call clinic at any time with any questions, concerns, or complaints.   I spent a total of *** minutes reviewing chart data, face-to-face evaluation with the patient, counseling and coordination of care as detailed above.  Michaelyn Barter, MD    10/29/2022 4:03 PM

## 2022-10-29 NOTE — Progress Notes (Unsigned)
Patient doesn't understand why she is here today.

## 2022-11-01 LAB — KAPPA/LAMBDA LIGHT CHAINS
Kappa free light chain: 18.4 mg/L (ref 3.3–19.4)
Kappa, lambda light chain ratio: 1.31 (ref 0.26–1.65)
Lambda free light chains: 14 mg/L (ref 5.7–26.3)

## 2022-11-02 DIAGNOSIS — D649 Anemia, unspecified: Secondary | ICD-10-CM | POA: Insufficient documentation

## 2022-11-02 DIAGNOSIS — I10 Essential (primary) hypertension: Secondary | ICD-10-CM | POA: Insufficient documentation

## 2022-11-02 DIAGNOSIS — N183 Chronic kidney disease, stage 3 unspecified: Secondary | ICD-10-CM | POA: Insufficient documentation

## 2022-11-03 LAB — MULTIPLE MYELOMA PANEL, SERUM
Albumin SerPl Elph-Mcnc: 4.4 g/dL (ref 2.9–4.4)
Albumin/Glob SerPl: 1.7 (ref 0.7–1.7)
Alpha 1: 0.2 g/dL (ref 0.0–0.4)
Alpha2 Glob SerPl Elph-Mcnc: 0.8 g/dL (ref 0.4–1.0)
B-Globulin SerPl Elph-Mcnc: 1 g/dL (ref 0.7–1.3)
Gamma Glob SerPl Elph-Mcnc: 0.7 g/dL (ref 0.4–1.8)
Globulin, Total: 2.7 g/dL (ref 2.2–3.9)
IgA: 178 mg/dL (ref 87–352)
IgG (Immunoglobin G), Serum: 777 mg/dL (ref 586–1602)
IgM (Immunoglobulin M), Srm: 33 mg/dL (ref 26–217)
Total Protein ELP: 7.1 g/dL (ref 6.0–8.5)

## 2022-12-14 DIAGNOSIS — F411 Generalized anxiety disorder: Secondary | ICD-10-CM | POA: Diagnosis not present

## 2022-12-14 DIAGNOSIS — F4001 Agoraphobia with panic disorder: Secondary | ICD-10-CM | POA: Diagnosis not present

## 2022-12-14 DIAGNOSIS — F3342 Major depressive disorder, recurrent, in full remission: Secondary | ICD-10-CM | POA: Diagnosis not present

## 2023-01-18 DIAGNOSIS — M25461 Effusion, right knee: Secondary | ICD-10-CM | POA: Diagnosis not present

## 2023-01-18 DIAGNOSIS — M25561 Pain in right knee: Secondary | ICD-10-CM | POA: Diagnosis not present

## 2023-01-25 ENCOUNTER — Ambulatory Visit: Payer: 59 | Admitting: Internal Medicine

## 2023-01-25 ENCOUNTER — Other Ambulatory Visit: Payer: 59

## 2023-01-31 ENCOUNTER — Other Ambulatory Visit: Payer: 59

## 2023-01-31 ENCOUNTER — Ambulatory Visit: Payer: 59 | Admitting: Internal Medicine

## 2023-02-09 DIAGNOSIS — F4001 Agoraphobia with panic disorder: Secondary | ICD-10-CM | POA: Diagnosis not present

## 2023-02-09 DIAGNOSIS — F3342 Major depressive disorder, recurrent, in full remission: Secondary | ICD-10-CM | POA: Diagnosis not present

## 2023-02-09 DIAGNOSIS — F411 Generalized anxiety disorder: Secondary | ICD-10-CM | POA: Diagnosis not present

## 2023-02-10 DIAGNOSIS — M2391 Unspecified internal derangement of right knee: Secondary | ICD-10-CM | POA: Diagnosis not present

## 2023-02-10 DIAGNOSIS — M1711 Unilateral primary osteoarthritis, right knee: Secondary | ICD-10-CM | POA: Diagnosis not present

## 2023-02-18 ENCOUNTER — Other Ambulatory Visit: Payer: Self-pay | Admitting: Orthopedic Surgery

## 2023-02-18 DIAGNOSIS — M2391 Unspecified internal derangement of right knee: Secondary | ICD-10-CM

## 2023-02-18 DIAGNOSIS — M1711 Unilateral primary osteoarthritis, right knee: Secondary | ICD-10-CM

## 2023-03-02 ENCOUNTER — Encounter: Payer: Self-pay | Admitting: Orthopedic Surgery

## 2023-03-05 ENCOUNTER — Other Ambulatory Visit: Payer: 59

## 2023-03-07 ENCOUNTER — Inpatient Hospital Stay (HOSPITAL_BASED_OUTPATIENT_CLINIC_OR_DEPARTMENT_OTHER): Payer: 59 | Admitting: Internal Medicine

## 2023-03-07 ENCOUNTER — Inpatient Hospital Stay: Payer: 59 | Attending: Internal Medicine

## 2023-03-07 VITALS — BP 150/88 | HR 86 | Temp 97.4°F | Wt 144.0 lb

## 2023-03-07 DIAGNOSIS — D649 Anemia, unspecified: Secondary | ICD-10-CM

## 2023-03-07 DIAGNOSIS — D509 Iron deficiency anemia, unspecified: Secondary | ICD-10-CM

## 2023-03-07 DIAGNOSIS — N1831 Chronic kidney disease, stage 3a: Secondary | ICD-10-CM | POA: Insufficient documentation

## 2023-03-07 DIAGNOSIS — Z79899 Other long term (current) drug therapy: Secondary | ICD-10-CM | POA: Diagnosis not present

## 2023-03-07 DIAGNOSIS — Z801 Family history of malignant neoplasm of trachea, bronchus and lung: Secondary | ICD-10-CM | POA: Insufficient documentation

## 2023-03-07 DIAGNOSIS — I10 Essential (primary) hypertension: Secondary | ICD-10-CM

## 2023-03-07 DIAGNOSIS — E611 Iron deficiency: Secondary | ICD-10-CM | POA: Insufficient documentation

## 2023-03-07 DIAGNOSIS — D631 Anemia in chronic kidney disease: Secondary | ICD-10-CM | POA: Diagnosis not present

## 2023-03-07 DIAGNOSIS — I129 Hypertensive chronic kidney disease with stage 1 through stage 4 chronic kidney disease, or unspecified chronic kidney disease: Secondary | ICD-10-CM | POA: Insufficient documentation

## 2023-03-07 LAB — CBC WITH DIFFERENTIAL (CANCER CENTER ONLY)
Abs Immature Granulocytes: 0.05 10*3/uL (ref 0.00–0.07)
Basophils Absolute: 0 10*3/uL (ref 0.0–0.1)
Basophils Relative: 1 %
Eosinophils Absolute: 0.2 10*3/uL (ref 0.0–0.5)
Eosinophils Relative: 2 %
HCT: 30.1 % — ABNORMAL LOW (ref 36.0–46.0)
Hemoglobin: 9.9 g/dL — ABNORMAL LOW (ref 12.0–15.0)
Immature Granulocytes: 1 %
Lymphocytes Relative: 25 %
Lymphs Abs: 1.7 10*3/uL (ref 0.7–4.0)
MCH: 31.3 pg (ref 26.0–34.0)
MCHC: 32.9 g/dL (ref 30.0–36.0)
MCV: 95.3 fL (ref 80.0–100.0)
Monocytes Absolute: 0.5 10*3/uL (ref 0.1–1.0)
Monocytes Relative: 8 %
Neutro Abs: 4.3 10*3/uL (ref 1.7–7.7)
Neutrophils Relative %: 63 %
Platelet Count: 254 10*3/uL (ref 150–400)
RBC: 3.16 MIL/uL — ABNORMAL LOW (ref 3.87–5.11)
RDW: 13.4 % (ref 11.5–15.5)
WBC Count: 6.8 10*3/uL (ref 4.0–10.5)
nRBC: 0 % (ref 0.0–0.2)

## 2023-03-07 LAB — IRON AND TIBC
Iron: 46 ug/dL (ref 28–170)
Saturation Ratios: 14 % (ref 10.4–31.8)
TIBC: 335 ug/dL (ref 250–450)
UIBC: 289 ug/dL

## 2023-03-07 NOTE — Progress Notes (Signed)
[facility]  Telephone:([phone] Fax:(336) [phone]  ID: Katelyn Gardner OB: [birth year]  MR#: [id number]  AOZ#:[id number]  Patient Care Team: Patient, No Pcp Per as PCP - General (General Practice) Michaelyn Barter, MD as Consulting Physician (Oncology)  REFERRING PROVIDER: Dr. Cherylann Ratel  REASON FOR REFERRAL: Anemia, CKD  HPI: Katelyn Gardner is a 62 y.o. female with past medical history of anxiety, hypertension, CKD stage III was referred to hematology for management of anemia.  Patient has CKD stage IIIa.  Follows with Dr. Cherylann Ratel.  Has history of hypertension on Coreg and enalapril. She denies any concerns today.   REVIEW OF SYSTEMS:   ROS  As per HPI. Otherwise, a complete review of systems is negative.  PAST MEDICAL HISTORY: Past Medical History:  Diagnosis Date  . Anxiety   . Hypertension   . Renal disorder     PAST SURGICAL HISTORY: No past surgical history on file.  FAMILY HISTORY: Family History  Problem Relation Age of Onset  . Lung cancer Father   . Lung cancer Brother     HEALTH MAINTENANCE: Social History   Tobacco Use  . Smoking status: Never  . Smokeless tobacco: Never  Substance Use Topics  . Alcohol use: No  . Drug use: No     Allergies  Allergen Reactions  . Sulfa Antibiotics Rash    Current Outpatient Medications  Medication Sig Dispense Refill  . atorvastatin (LIPITOR) 10 MG tablet Take 10 mg by mouth daily.    . butalbital-aspirin-caffeine-codeine (FIORINAL WITH CODEINE) 50-325-40-30 MG capsule Take 1 capsule by mouth every 4 (four) hours as needed for migraine. As needed    . carvedilol (COREG) 3.125 MG tablet Take by mouth. (Patient not taking: Reported on 10/29/2022)    . cetirizine-pseudoephedrine (ZYRTEC-D) 5-120 MG tablet Take 1 tablet by mouth every morning.    . clonazePAM (KLONOPIN) 0.5 MG tablet Take 0.5 mg by mouth 3 (three) times daily.    . cyclobenzaprine (FLEXERIL) 10 MG tablet Take 1 tablet (10  mg total) by mouth 3 (three) times daily as needed for muscle spasms. 30 tablet 0  . enalapril (VASOTEC) 5 MG tablet Take 5 mg by mouth daily.    Marland Kitchen FLUoxetine (PROZAC) 40 MG capsule Take 40 mg by mouth daily.    Marland Kitchen HYDROcodone-acetaminophen (NORCO/VICODIN) 5-325 MG tablet Take by mouth.    . propranolol (INDERAL) 10 MG tablet Take 10 mg by mouth daily.    Marland Kitchen zolpidem (AMBIEN) 10 MG tablet Take 10 mg by mouth at bedtime as needed.     No current facility-administered medications for this visit.    OBJECTIVE: There were no vitals filed for this visit.    There is no height or weight on file to calculate BMI.      General: Well-developed, well-nourished, no acute distress. Eyes: Pink conjunctiva, anicteric sclera. HEENT: Normocephalic, moist mucous membranes, clear oropharnyx. Lungs: Clear to auscultation bilaterally. Heart: Regular rate and rhythm. No rubs, murmurs, or gallops. Abdomen: Soft, nontender, nondistended. No organomegaly noted, normoactive bowel sounds. Musculoskeletal: No edema, cyanosis, or clubbing. Neuro: Alert, answering all questions appropriately. Cranial nerves grossly intact. Skin: No rashes or petechiae noted. Psych: Normal affect. Lymphatics: No cervical, calvicular, axillary or inguinal LAD.   LAB RESULTS:  No results found for: "NA", "K", "CL", "CO2", "GLUCOSE", "BUN", "CREATININE", "CALCIUM", "PROT", "ALBUMIN", "AST", "ALT", "ALKPHOS", "BILITOT", "GFRNONAA", "GFRAA"  Lab Results  Component Value Date   WBC 6.2 10/29/2022   NEUTROABS 3.9 10/29/2022   HGB  10.8 (L) 10/29/2022   HCT 32.3 (L) 10/29/2022   MCV 92.6 10/29/2022   PLT 348 10/29/2022    Lab Results  Component Value Date   TIBC 351 10/29/2022   FERRITIN 16 10/29/2022   IRONPCTSAT 16 10/29/2022     STUDIES: No results found.  ASSESSMENT AND PLAN:   Katelyn Gardner is a 62 y.o. female with pmh of anxiety, hypertension, CKD stage III was referred to hematology for management of  anemia.  # Normocytic anemia # CKD stage IIIa -Progressive. Recent labs reviewed from 10/04/2022.  Hemoglobin 9.8, creatinine 1.8.  -I discussed with the patient about potential causes of anemia such as nutritional deficiencies, plasma cell disease patient or chronic kidney disease.  I will obtain iron panel, B12, ferritin and myeloma panel to rule out other etiologies.  There is contribution of chronic kidney disease also.  I will check EPO level.  If EPO level is low, she will benefit from EPO injections down the line.  Currently she is maintaining her hemoglobin well between 9-10.  There is no indication of starting on injection.  Side effects such as hypertension, increased risk of thromboembolic event or cardiovascular event with hemoglobin more than 11 was discussed.  Patient agreeable with the plan.  I will follow-up with her in 3 months  No orders of the defined types were placed in this encounter.  RTC in 3 months for MD visit, labs  Patient expressed understanding and was in agreement with this plan. She also understands that She can call clinic at any time with any questions, concerns, or complaints.   I spent a total of 35 minutes reviewing chart data, face-to-face evaluation with the patient, counseling and coordination of care as detailed above.  Michaelyn Barter, MD   03/07/2023 1:42 PM

## 2023-03-07 NOTE — Patient Instructions (Signed)
Gentle iron or Slow Fe - over the counter. 3 times a week.

## 2023-03-08 DIAGNOSIS — D509 Iron deficiency anemia, unspecified: Secondary | ICD-10-CM | POA: Insufficient documentation

## 2023-03-16 ENCOUNTER — Encounter: Payer: Self-pay | Admitting: Orthopedic Surgery

## 2023-03-17 ENCOUNTER — Encounter: Payer: Self-pay | Admitting: Orthopedic Surgery

## 2023-03-18 ENCOUNTER — Encounter: Payer: Self-pay | Admitting: Orthopedic Surgery

## 2023-05-02 ENCOUNTER — Inpatient Hospital Stay: Payer: 59 | Attending: Internal Medicine

## 2023-05-02 DIAGNOSIS — I129 Hypertensive chronic kidney disease with stage 1 through stage 4 chronic kidney disease, or unspecified chronic kidney disease: Secondary | ICD-10-CM | POA: Diagnosis not present

## 2023-05-02 DIAGNOSIS — N1831 Chronic kidney disease, stage 3a: Secondary | ICD-10-CM | POA: Diagnosis not present

## 2023-05-02 DIAGNOSIS — D509 Iron deficiency anemia, unspecified: Secondary | ICD-10-CM

## 2023-05-02 DIAGNOSIS — D649 Anemia, unspecified: Secondary | ICD-10-CM

## 2023-05-02 DIAGNOSIS — D631 Anemia in chronic kidney disease: Secondary | ICD-10-CM | POA: Diagnosis not present

## 2023-05-02 DIAGNOSIS — I10 Essential (primary) hypertension: Secondary | ICD-10-CM

## 2023-05-02 LAB — CBC WITH DIFFERENTIAL (CANCER CENTER ONLY)
Abs Immature Granulocytes: 0.03 10*3/uL (ref 0.00–0.07)
Basophils Absolute: 0 10*3/uL (ref 0.0–0.1)
Basophils Relative: 1 %
Eosinophils Absolute: 0.2 10*3/uL (ref 0.0–0.5)
Eosinophils Relative: 2 %
HCT: 36.1 % (ref 36.0–46.0)
Hemoglobin: 11.9 g/dL — ABNORMAL LOW (ref 12.0–15.0)
Immature Granulocytes: 0 %
Lymphocytes Relative: 28 %
Lymphs Abs: 2.2 10*3/uL (ref 0.7–4.0)
MCH: 30.8 pg (ref 26.0–34.0)
MCHC: 33 g/dL (ref 30.0–36.0)
MCV: 93.5 fL (ref 80.0–100.0)
Monocytes Absolute: 0.5 10*3/uL (ref 0.1–1.0)
Monocytes Relative: 6 %
Neutro Abs: 4.9 10*3/uL (ref 1.7–7.7)
Neutrophils Relative %: 63 %
Platelet Count: 343 10*3/uL (ref 150–400)
RBC: 3.86 MIL/uL — ABNORMAL LOW (ref 3.87–5.11)
RDW: 13 % (ref 11.5–15.5)
WBC Count: 8 10*3/uL (ref 4.0–10.5)
nRBC: 0 % (ref 0.0–0.2)

## 2023-05-02 LAB — IRON AND TIBC
Iron: 92 ug/dL (ref 28–170)
Saturation Ratios: 23 % (ref 10.4–31.8)
TIBC: 403 ug/dL (ref 250–450)
UIBC: 311 ug/dL

## 2023-05-02 LAB — FERRITIN: Ferritin: 13 ng/mL (ref 11–307)

## 2023-05-02 LAB — VITAMIN B12: Vitamin B-12: 2994 pg/mL — ABNORMAL HIGH (ref 180–914)

## 2023-05-11 ENCOUNTER — Encounter: Payer: Self-pay | Admitting: Orthopedic Surgery

## 2023-05-14 ENCOUNTER — Ambulatory Visit
Admission: RE | Admit: 2023-05-14 | Discharge: 2023-05-14 | Disposition: A | Discharge status: Home / Self Care | Payer: 59 | Source: Ambulatory Visit | Attending: Orthopedic Surgery | Admitting: Orthopedic Surgery

## 2023-05-14 DIAGNOSIS — M1711 Unilateral primary osteoarthritis, right knee: Secondary | ICD-10-CM

## 2023-05-14 DIAGNOSIS — S83231A Complex tear of medial meniscus, current injury, right knee, initial encounter: Secondary | ICD-10-CM | POA: Diagnosis not present

## 2023-05-14 DIAGNOSIS — M2391 Unspecified internal derangement of right knee: Secondary | ICD-10-CM

## 2023-05-14 DIAGNOSIS — M25561 Pain in right knee: Secondary | ICD-10-CM | POA: Diagnosis not present

## 2023-05-14 DIAGNOSIS — S83281A Other tear of lateral meniscus, current injury, right knee, initial encounter: Secondary | ICD-10-CM | POA: Diagnosis not present

## 2023-06-01 DIAGNOSIS — F5104 Psychophysiologic insomnia: Secondary | ICD-10-CM | POA: Diagnosis not present

## 2023-06-01 DIAGNOSIS — M545 Low back pain, unspecified: Secondary | ICD-10-CM | POA: Diagnosis not present

## 2023-06-01 DIAGNOSIS — Z79899 Other long term (current) drug therapy: Secondary | ICD-10-CM | POA: Diagnosis not present

## 2023-06-01 DIAGNOSIS — G8929 Other chronic pain: Secondary | ICD-10-CM | POA: Diagnosis not present

## 2023-06-01 DIAGNOSIS — I10 Essential (primary) hypertension: Secondary | ICD-10-CM | POA: Diagnosis not present

## 2023-06-01 DIAGNOSIS — Z79891 Long term (current) use of opiate analgesic: Secondary | ICD-10-CM | POA: Diagnosis not present

## 2023-06-01 DIAGNOSIS — E782 Mixed hyperlipidemia: Secondary | ICD-10-CM | POA: Diagnosis not present

## 2023-06-01 DIAGNOSIS — N1831 Chronic kidney disease, stage 3a: Secondary | ICD-10-CM | POA: Diagnosis not present

## 2023-06-01 DIAGNOSIS — F419 Anxiety disorder, unspecified: Secondary | ICD-10-CM | POA: Diagnosis not present

## 2023-06-02 DIAGNOSIS — F411 Generalized anxiety disorder: Secondary | ICD-10-CM | POA: Diagnosis not present

## 2023-06-10 DIAGNOSIS — M1711 Unilateral primary osteoarthritis, right knee: Secondary | ICD-10-CM | POA: Diagnosis not present

## 2023-06-10 DIAGNOSIS — S83231A Complex tear of medial meniscus, current injury, right knee, initial encounter: Secondary | ICD-10-CM | POA: Diagnosis not present

## 2023-07-26 ENCOUNTER — Other Ambulatory Visit: Payer: Self-pay | Admitting: Surgery

## 2023-07-29 ENCOUNTER — Encounter
Admission: RE | Admit: 2023-07-29 | Discharge: 2023-07-29 | Disposition: A | Discharge status: Home / Self Care | Source: Ambulatory Visit | Attending: Surgery | Admitting: Surgery

## 2023-07-29 ENCOUNTER — Other Ambulatory Visit: Payer: Self-pay

## 2023-07-29 VITALS — BP 128/95 | HR 75 | Temp 97.9°F | Resp 18 | Ht 59.0 in | Wt 138.0 lb

## 2023-07-29 DIAGNOSIS — Z01818 Encounter for other preprocedural examination: Secondary | ICD-10-CM | POA: Insufficient documentation

## 2023-07-29 DIAGNOSIS — Z01812 Encounter for preprocedural laboratory examination: Secondary | ICD-10-CM

## 2023-07-29 HISTORY — DX: Hyperlipidemia, unspecified: E78.5

## 2023-07-29 LAB — CBC WITH DIFFERENTIAL/PLATELET
Abs Immature Granulocytes: 0.03 10*3/uL (ref 0.00–0.07)
Basophils Absolute: 0.1 10*3/uL (ref 0.0–0.1)
Basophils Relative: 1 %
Eosinophils Absolute: 0.7 10*3/uL — ABNORMAL HIGH (ref 0.0–0.5)
Eosinophils Relative: 8 %
HCT: 31.5 % — ABNORMAL LOW (ref 36.0–46.0)
Hemoglobin: 10.6 g/dL — ABNORMAL LOW (ref 12.0–15.0)
Immature Granulocytes: 0 %
Lymphocytes Relative: 32 %
Lymphs Abs: 2.5 10*3/uL (ref 0.7–4.0)
MCH: 30.6 pg (ref 26.0–34.0)
MCHC: 33.7 g/dL (ref 30.0–36.0)
MCV: 91 fL (ref 80.0–100.0)
Monocytes Absolute: 0.5 10*3/uL (ref 0.1–1.0)
Monocytes Relative: 7 %
Neutro Abs: 4 10*3/uL (ref 1.7–7.7)
Neutrophils Relative %: 52 %
Platelets: 329 10*3/uL (ref 150–400)
RBC: 3.46 MIL/uL — ABNORMAL LOW (ref 3.87–5.11)
RDW: 13.9 % (ref 11.5–15.5)
WBC: 7.8 10*3/uL (ref 4.0–10.5)
nRBC: 0 % (ref 0.0–0.2)

## 2023-07-29 LAB — COMPREHENSIVE METABOLIC PANEL WITH GFR
ALT: 34 U/L (ref 0–44)
AST: 42 U/L — ABNORMAL HIGH (ref 15–41)
Albumin: 4.3 g/dL (ref 3.5–5.0)
Alkaline Phosphatase: 109 U/L (ref 38–126)
Anion gap: 11 (ref 5–15)
BUN: 15 mg/dL (ref 8–23)
CO2: 28 mmol/L (ref 22–32)
Calcium: 9.6 mg/dL (ref 8.9–10.3)
Chloride: 92 mmol/L — ABNORMAL LOW (ref 98–111)
Creatinine, Ser: 0.95 mg/dL (ref 0.44–1.00)
GFR, Estimated: >60 mL/min (ref >60)
Glucose, Bld: 93 mg/dL (ref 70–99)
Potassium: 3.8 mmol/L (ref 3.5–5.1)
Sodium: 131 mmol/L — ABNORMAL LOW (ref 135–145)
Total Bilirubin: 0.2 mg/dL (ref 0.0–1.2)
Total Protein: 7.5 g/dL (ref 6.5–8.1)

## 2023-07-29 LAB — URINALYSIS, ROUTINE W REFLEX MICROSCOPIC
Bilirubin Urine: NEGATIVE
Glucose, UA: NEGATIVE mg/dL
Hgb urine dipstick: NEGATIVE
Ketones, ur: NEGATIVE mg/dL
Leukocytes,Ua: NEGATIVE
Nitrite: NEGATIVE
Protein, ur: NEGATIVE mg/dL
Specific Gravity, Urine: 1.005 (ref 1.005–1.030)
pH: 7 (ref 5.0–8.0)

## 2023-07-29 LAB — SURGICAL PCR SCREEN
MRSA, PCR: NEGATIVE
Staphylococcus aureus: POSITIVE — AB

## 2023-07-29 NOTE — Patient Instructions (Addendum)
 Your procedure is scheduled on:  TUESDAY APRIL 8  Report to the Registration Desk on the 1st floor of the [facility]. To find out your arrival time, please call [phone] between 1PM - 3PM on:  MONDAY APRIL 7  If your arrival time is 6:00 am, do not arrive before that time as the [facility] entrance doors do not open until 6:00 am.  REMEMBER: Instructions that are not followed completely may result in serious medical risk, up to and including death; or upon the discretion of your surgeon and anesthesiologist your surgery may need to be rescheduled.  Do not eat food after midnight the night before surgery.  No gum chewing or hard candies.  You may however, drink CLEAR liquids up to 2 hours before you are scheduled to arrive for your surgery. Do not drink anything within 2 hours of your scheduled arrival time.  Clear liquids include: - water  - apple juice without pulp - gatorade (not RED colors) - black coffee or tea (Do NOT add milk or creamers to the coffee or tea) Do NOT drink anything that is not on this list.  In addition, your doctor has ordered for you to drink the provided:  Ensure Pre-Surgery Clear Carbohydrate Drink  Drinking this carbohydrate drink up to two hours before surgery helps to reduce insulin resistance and improve patient outcomes. Please complete drinking 2 hours before scheduled arrival time.  One week prior to surgery: TUESDAY APRIL 1  Stop Anti-inflammatories (NSAIDS) such as Advil, Aleve, Ibuprofen, Motrin, Naproxen, Naprosyn and Aspirin based products such as Excedrin, Goody's Powder, BC Powder. Stop ANY OVER THE COUNTER supplements until after surgery.  You may however, continue to take Tylenol if needed for pain up until the day of surgery.  Continue taking all of your other prescription medications up until the day of surgery.  ON THE DAY OF SURGERY ONLY TAKE THESE MEDICATIONS WITH SIPS OF WATER:  clonazePAM (KLONOPIN)  FLUoxetine (PROZAC)    No Alcohol for 24 hours before or after surgery.  No Smoking including e-cigarettes for 24 hours before surgery.  No chewable tobacco products for at least 6 hours before surgery.  No nicotine patches on the day of surgery.  Do not use any "recreational" drugs for at least a week (preferably 2 weeks) before your surgery.  Please be advised that the combination of cocaine and anesthesia may have negative outcomes, up to and including death. If you test positive for cocaine, your surgery will be cancelled.  On the morning of surgery brush your teeth with toothpaste and water, you may rinse your mouth with mouthwash if you wish. Do not swallow any toothpaste or mouthwash.  Use CHG Soap as directed on instruction sheet.  Do not wear jewelry, make-up, hairpins, clips or nail polish.  For welded (permanent) jewelry: bracelets, anklets, waist bands, etc.  Please have this removed prior to surgery.  If it is not removed, there is a chance that hospital personnel will need to cut it off on the day of surgery.  Do not wear lotions, powders, or perfumes.   Do not shave body hair from the neck down 48 hours before surgery.  Contact lenses, hearing aids and dentures may not be worn into surgery.  Do not bring valuables to the hospital. [facility] is not responsible for any missing/lost belongings or valuables.   Notify your doctor if there is any change in your medical condition (cold, fever, infection).  Wear comfortable clothing (specific to  your surgery type) to the hospital.  After surgery, you can help prevent lung complications by doing breathing exercises.  Take deep breaths and cough every 1-2 hours. Your doctor may order a device called an Incentive Spirometer to help you take deep breaths.  If you are being admitted to the hospital overnight, leave your suitcase in the car. After surgery it may be brought to your room.  In case of increased patient census, it may be necessary  for you, the patient, to continue your postoperative care in the Same Day Surgery department.  If you are being discharged the day of surgery, you will not be allowed to drive home. You will need a responsible individual to drive you home and stay with you for 24 hours after surgery.   If you are taking public transportation, you will need to have a responsible individual with you.  Please call the Pre-admissions Testing Dept. at [phone] if you have any questions about these instructions.  Surgery Visitation Policy:  Patients having surgery or a procedure may have two visitors.  Children under the age of 66 must have an adult with them who is not the patient.  Inpatient Visitation:    Visiting hours are 7 a.m. to 8 p.m. Up to four visitors are allowed at one time in a patient room. The visitors may rotate out with other people during the day.  One visitor age 51 or older may stay with the patient overnight and must be in the room by 8 p.m.      Pre-operative 5 CHG Bath Instructions   You can play a key role in reducing the risk of infection after surgery. Your skin needs to be as free of germs as possible. You can reduce the number of germs on your skin by washing with CHG (chlorhexidine gluconate) soap before surgery. CHG is an antiseptic soap that kills germs and continues to kill germs even after washing.   DO NOT use if you have an allergy to chlorhexidine/CHG or antibacterial soaps. If your skin becomes reddened or irritated, stop using the CHG and notify one of our RNs at [phone].   Please shower with the CHG soap starting 4 days before surgery using the following schedule:   STARTING FRIDAY APRIL 4     Please keep in mind the following:  DO NOT shave, including legs and underarms, starting the day of your first shower.   You may shave your face at any point before/day of surgery.  Place clean sheets on your bed the day you start using CHG soap. Use a clean  washcloth (not used since being washed) for each shower. DO NOT sleep with pets once you start using the CHG.   CHG Shower Instructions:  If you choose to wash your hair and private area, wash first with your normal shampoo/soap.  After you use shampoo/soap, rinse your hair and body thoroughly to remove shampoo/soap residue.  Turn the water OFF and apply about 3 tablespoons (45 ml) of CHG soap to a CLEAN washcloth.  Apply CHG soap ONLY FROM YOUR NECK DOWN TO YOUR TOES (washing for 3-5 minutes)  DO NOT use CHG soap on face, private areas, open wounds, or sores.  Pay special attention to the area where your surgery is being performed.  If you are having back surgery, having someone wash your back for you may be helpful. Wait 2 minutes after CHG soap is applied, then you may rinse off the CHG soap.  Pat dry with a clean towel  Put on clean clothes/pajamas   If you choose to wear lotion, please use ONLY the CHG-compatible lotions on the back of this paper.     Additional instructions for the day of surgery: DO NOT APPLY any lotions, deodorants, cologne, or perfumes.   Put on clean/comfortable clothes.  Brush your teeth.  Ask your nurse before applying any prescription medications to the skin.      CHG Compatible Lotions   Aveeno Moisturizing lotion  Cetaphil Moisturizing Cream  Cetaphil Moisturizing Lotion  Clairol Herbal Essence Moisturizing Lotion, Dry Skin  Clairol Herbal Essence Moisturizing Lotion, Extra Dry Skin  Clairol Herbal Essence Moisturizing Lotion, Normal Skin  Curel Age Defying Therapeutic Moisturizing Lotion with Alpha Hydroxy  Curel Extreme Care Body Lotion  Curel Soothing Hands Moisturizing Hand Lotion  Curel Therapeutic Moisturizing Cream, Fragrance-Free  Curel Therapeutic Moisturizing Lotion, Fragrance-Free  Curel Therapeutic Moisturizing Lotion, Original Formula  Eucerin Daily Replenishing Lotion  Eucerin Dry Skin Therapy Plus Alpha Hydroxy Crme  Eucerin  Dry Skin Therapy Plus Alpha Hydroxy Lotion  Eucerin Original Crme  Eucerin Original Lotion  Eucerin Plus Crme Eucerin Plus Lotion  Eucerin TriLipid Replenishing Lotion  Keri Anti-Bacterial Hand Lotion  Keri Deep Conditioning Original Lotion Dry Skin Formula Softly Scented  Keri Deep Conditioning Original Lotion, Fragrance Free Sensitive Skin Formula  Keri Lotion Fast Absorbing Fragrance Free Sensitive Skin Formula  Keri Lotion Fast Absorbing Softly Scented Dry Skin Formula  Keri Original Lotion  Keri Skin Renewal Lotion Keri Silky Smooth Lotion  Keri Silky Smooth Sensitive Skin Lotion  Nivea Body Creamy Conditioning Oil  Nivea Body Extra Enriched Lotion  Nivea Body Original Lotion  Nivea Body Sheer Moisturizing Lotion Nivea Crme  Nivea Skin Firming Lotion  NutraDerm 30 Skin Lotion  NutraDerm Skin Lotion  NutraDerm Therapeutic Skin Cream  NutraDerm Therapeutic Skin Lotion  ProShield Protective Hand Cream  Provon moisturizing lotion    How to Use an Incentive Spirometer  An incentive spirometer is a tool that measures how well you are filling your lungs with each breath. Learning to take long, deep breaths using this tool can help you keep your lungs clear and active. This may help to reverse or lessen your chance of developing breathing (pulmonary) problems, especially infection. You may be asked to use a spirometer: After a surgery. If you have a lung problem or a history of smoking. After a long period of time when you have been unable to move or be active. If the spirometer includes an indicator to show the highest number that you have reached, your health care provider or respiratory therapist will help you set a goal. Keep a log of your progress as told by your health care provider. What are the risks? Breathing too quickly may cause dizziness or cause you to pass out. Take your time so you do not get dizzy or light-headed. If you are in pain, you may need to take pain  medicine before doing incentive spirometry. It is harder to take a deep breath if you are having pain. How to use your incentive spirometer  Sit up on the edge of your bed or on a chair. Hold the incentive spirometer so that it is in an upright position. Before you use the spirometer, breathe out normally. Place the mouthpiece in your mouth. Make sure your lips are closed tightly around it. Breathe in slowly and as deeply as you can through your mouth, causing the piston or the ball  to rise toward the top of the chamber. Hold your breath for 3-5 seconds, or for as long as possible. If the spirometer includes a coach indicator, use this to guide you in breathing. Slow down your breathing if the indicator goes above the marked areas. Remove the mouthpiece from your mouth and breathe out normally. The piston or ball will return to the bottom of the chamber. Rest for a few seconds, then repeat the steps 10 or more times. Take your time and take a few normal breaths between deep breaths so that you do not get dizzy or light-headed. Do this every 1-2 hours when you are awake. If the spirometer includes a goal marker to show the highest number you have reached (best effort), use this as a goal to work toward during each repetition. After each set of 10 deep breaths, cough a few times. This will help to make sure that your lungs are clear. If you have an incision on your chest or abdomen from surgery, place a pillow or a rolled-up towel firmly against the incision when you cough. This can help to reduce pain while taking deep breaths and coughing. General tips When you are able to get out of bed: Walk around often. Continue to take deep breaths and cough in order to clear your lungs. Keep using the incentive spirometer until your health care provider says it is okay to stop using it. If you have been in the hospital, you may be told to keep using the spirometer at home. Contact a health care provider  if: You are having difficulty using the spirometer. You have trouble using the spirometer as often as instructed. Your pain medicine is not giving enough relief for you to use the spirometer as told. You have a fever. Get help right away if: You develop shortness of breath. You develop a cough with bloody mucus from the lungs. You have fluid or blood coming from an incision site after you cough. Summary An incentive spirometer is a tool that can help you learn to take long, deep breaths to keep your lungs clear and active. You may be asked to use a spirometer after a surgery, if you have a lung problem or a history of smoking, or if you have been inactive for a long period of time. Use your incentive spirometer as instructed every 1-2 hours while you are awake. If you have an incision on your chest or abdomen, place a pillow or a rolled-up towel firmly against your incision when you cough. This will help to reduce pain. Get help right away if you have shortness of breath, you cough up bloody mucus, or blood comes from your incision when you cough. This information is not intended to replace advice given to you by your health care provider. Make sure you discuss any questions you have with your health care provider. Document Revised: 07/09/2019 Document Reviewed: 07/09/2019 Elsevier Patient Education  2023 [employer]          Preoperative Educational Videos for Total Hip, Knee and Shoulder Replacements  To better prepare for surgery, please view our videos that explain the physical activity and discharge planning required to have the best surgical recovery at [facility].  [email or web address]  Questions? Call [phone] or email [email or web address]

## 2023-08-09 ENCOUNTER — Ambulatory Visit
Admission: RE | Admit: 2023-08-09 | Discharge: 2023-08-09 | Disposition: A | Discharge status: Home / Self Care | Attending: Surgery | Admitting: Surgery

## 2023-08-09 ENCOUNTER — Ambulatory Visit

## 2023-08-09 ENCOUNTER — Other Ambulatory Visit: Payer: Self-pay

## 2023-08-09 ENCOUNTER — Encounter
Admission: RE | Disposition: A | Discharge status: Home / Self Care | Payer: Self-pay | Source: Home / Self Care | Attending: Surgery

## 2023-08-09 ENCOUNTER — Ambulatory Visit: Payer: Self-pay | Admitting: Urgent Care

## 2023-08-09 ENCOUNTER — Encounter: Payer: Self-pay | Admitting: Surgery

## 2023-08-09 DIAGNOSIS — F419 Anxiety disorder, unspecified: Secondary | ICD-10-CM | POA: Insufficient documentation

## 2023-08-09 DIAGNOSIS — Z8249 Family history of ischemic heart disease and other diseases of the circulatory system: Secondary | ICD-10-CM | POA: Diagnosis not present

## 2023-08-09 DIAGNOSIS — R2689 Other abnormalities of gait and mobility: Secondary | ICD-10-CM | POA: Insufficient documentation

## 2023-08-09 DIAGNOSIS — N183 Chronic kidney disease, stage 3 unspecified: Secondary | ICD-10-CM | POA: Diagnosis not present

## 2023-08-09 DIAGNOSIS — M1711 Unilateral primary osteoarthritis, right knee: Secondary | ICD-10-CM | POA: Insufficient documentation

## 2023-08-09 DIAGNOSIS — I129 Hypertensive chronic kidney disease with stage 1 through stage 4 chronic kidney disease, or unspecified chronic kidney disease: Secondary | ICD-10-CM | POA: Insufficient documentation

## 2023-08-09 HISTORY — PX: TOTAL KNEE ARTHROPLASTY: SHX125

## 2023-08-09 SURGERY — ARTHROPLASTY, KNEE, TOTAL
Anesthesia: Spinal | Site: Knee | Laterality: Right

## 2023-08-09 MED ORDER — BUPIVACAINE HCL (PF) 0.5 % IJ SOLN
INTRAMUSCULAR | Status: AC
  Filled 2023-08-09: qty 10

## 2023-08-09 MED ORDER — BUPIVACAINE-EPINEPHRINE (PF) 0.5% -1:200000 IJ SOLN
INTRAMUSCULAR | Status: DC | PRN
  Administered 2023-08-09: 30 mL

## 2023-08-09 MED ORDER — SODIUM CHLORIDE 0.9 % BOLUS PEDS
250.0000 mL | Freq: Once | INTRAVENOUS | Status: AC
  Administered 2023-08-09: 250 mL via INTRAVENOUS

## 2023-08-09 MED ORDER — APIXABAN 2.5 MG PO TABS: 2.5000 mg | ORAL_TABLET | Freq: Two times a day (BID) | ORAL | 0 refills | Status: AC

## 2023-08-09 MED ORDER — LACTATED RINGERS IV SOLN: INTRAVENOUS | Status: DC

## 2023-08-09 MED ORDER — SODIUM CHLORIDE 0.9 % BOLUS PEDS: 250.0000 mL | Freq: Once | INTRAVENOUS | Status: DC

## 2023-08-09 MED ORDER — CEFAZOLIN SODIUM-DEXTROSE 2-4 GM/100ML-% IV SOLN
INTRAVENOUS | Status: AC
  Filled 2023-08-09: qty 100

## 2023-08-09 MED ORDER — ONDANSETRON HCL 4 MG PO TABS: 4.0000 mg | ORAL_TABLET | Freq: Four times a day (QID) | ORAL | Status: DC | PRN

## 2023-08-09 MED ORDER — ONDANSETRON HCL 4 MG/2ML IJ SOLN
INTRAMUSCULAR | Status: AC
  Filled 2023-08-09: qty 2

## 2023-08-09 MED ORDER — BUPIVACAINE-EPINEPHRINE (PF) 0.5% -1:200000 IJ SOLN
INTRAMUSCULAR | Status: AC
  Filled 2023-08-09: qty 30

## 2023-08-09 MED ORDER — CHLORHEXIDINE GLUCONATE 0.12 % MT SOLN
OROMUCOSAL | Status: AC
  Filled 2023-08-09: qty 15

## 2023-08-09 MED ORDER — PHENYLEPHRINE HCL-NACL 20-0.9 MG/250ML-% IV SOLN
INTRAVENOUS | Status: AC
  Filled 2023-08-09: qty 250

## 2023-08-09 MED ORDER — BUPIVACAINE LIPOSOME 1.3 % IJ SUSP
INTRAMUSCULAR | Status: AC
  Filled 2023-08-09: qty 20

## 2023-08-09 MED ORDER — SODIUM CHLORIDE 0.9 % IV SOLN
INTRAVENOUS | Status: DC | PRN
  Administered 2023-08-09: 60 mL

## 2023-08-09 MED ORDER — MUPIROCIN 2 % EX OINT: 1.0000 | TOPICAL_OINTMENT | Freq: Two times a day (BID) | CUTANEOUS | 0 refills | Status: AC

## 2023-08-09 MED ORDER — FENTANYL CITRATE (PF) 100 MCG/2ML IJ SOLN
INTRAMUSCULAR | Status: AC
  Filled 2023-08-09: qty 2

## 2023-08-09 MED ORDER — FENTANYL CITRATE (PF) 100 MCG/2ML IJ SOLN
INTRAMUSCULAR | Status: DC | PRN
  Administered 2023-08-09: 50 ug via INTRAVENOUS
  Administered 2023-08-09 (×2): 25 ug via INTRAVENOUS

## 2023-08-09 MED ORDER — METOCLOPRAMIDE HCL 5 MG/ML IJ SOLN: 5.0000 mg | Freq: Three times a day (TID) | INTRAMUSCULAR | Status: DC | PRN

## 2023-08-09 MED ORDER — MIDAZOLAM HCL 2 MG/2ML IJ SOLN
INTRAMUSCULAR | Status: AC
  Filled 2023-08-09: qty 2

## 2023-08-09 MED ORDER — CHLORHEXIDINE GLUCONATE 0.12 % MT SOLN
15.0000 mL | Freq: Once | OROMUCOSAL | Status: AC
  Administered 2023-08-09: 15 mL via OROMUCOSAL

## 2023-08-09 MED ORDER — CEFAZOLIN SODIUM-DEXTROSE 2-4 GM/100ML-% IV SOLN
2.0000 g | Freq: Four times a day (QID) | INTRAVENOUS | Status: DC
  Administered 2023-08-09: 2 g via INTRAVENOUS

## 2023-08-09 MED ORDER — ACETAMINOPHEN 325 MG PO TABS: 325.0000 mg | ORAL_TABLET | Freq: Four times a day (QID) | ORAL | Status: DC | PRN

## 2023-08-09 MED ORDER — DEXAMETHASONE SODIUM PHOSPHATE 10 MG/ML IJ SOLN
INTRAMUSCULAR | Status: DC | PRN
  Administered 2023-08-09: 5 mg via INTRAVENOUS

## 2023-08-09 MED ORDER — KETOROLAC TROMETHAMINE 15 MG/ML IJ SOLN: 15.0000 mg | Freq: Once | INTRAMUSCULAR | Status: DC

## 2023-08-09 MED ORDER — OXYCODONE HCL 5 MG PO TABS
ORAL_TABLET | ORAL | Status: AC
  Filled 2023-08-09: qty 1

## 2023-08-09 MED ORDER — PHENYLEPHRINE 80 MCG/ML (10ML) SYRINGE FOR IV PUSH (FOR BLOOD PRESSURE SUPPORT)
PREFILLED_SYRINGE | INTRAVENOUS | Status: DC | PRN
  Administered 2023-08-09: 80 ug via INTRAVENOUS
  Administered 2023-08-09: 160 ug via INTRAVENOUS
  Administered 2023-08-09: 80 ug via INTRAVENOUS
  Administered 2023-08-09: 160 ug via INTRAVENOUS
  Administered 2023-08-09: 80 ug via INTRAVENOUS

## 2023-08-09 MED ORDER — BUPIVACAINE HCL (PF) 0.5 % IJ SOLN
INTRAMUSCULAR | Status: DC | PRN
  Administered 2023-08-09: 2.2 mL

## 2023-08-09 MED ORDER — SODIUM CHLORIDE (PF) 0.9 % IJ SOLN
INTRAMUSCULAR | Status: AC
  Filled 2023-08-09: qty 50

## 2023-08-09 MED ORDER — LIDOCAINE HCL 1 % IJ SOLN
INTRAMUSCULAR | Status: DC | PRN
  Administered 2023-08-09: 3 mL via INTRADERMAL
  Administered 2023-08-09: 2 mL via INTRADERMAL

## 2023-08-09 MED ORDER — ORAL CARE MOUTH RINSE: 15.0000 mL | Freq: Once | OROMUCOSAL | Status: AC

## 2023-08-09 MED ORDER — TRANEXAMIC ACID-NACL 1000-0.7 MG/100ML-% IV SOLN
INTRAVENOUS | Status: AC
  Filled 2023-08-09: qty 100

## 2023-08-09 MED ORDER — TRIAMCINOLONE ACETONIDE 40 MG/ML IJ SUSP
INTRAMUSCULAR | Status: AC
  Filled 2023-08-09: qty 1

## 2023-08-09 MED ORDER — PROPOFOL 500 MG/50ML IV EMUL
INTRAVENOUS | Status: DC | PRN
  Administered 2023-08-09: 125 ug/kg/min via INTRAVENOUS

## 2023-08-09 MED ORDER — PROPOFOL 10 MG/ML IV BOLUS
INTRAVENOUS | Status: DC | PRN
  Administered 2023-08-09: 20 mg via INTRAVENOUS

## 2023-08-09 MED ORDER — PHENYLEPHRINE HCL-NACL 20-0.9 MG/250ML-% IV SOLN
INTRAVENOUS | Status: DC | PRN
  Administered 2023-08-09: 30 ug/min via INTRAVENOUS

## 2023-08-09 MED ORDER — ONDANSETRON HCL 4 MG/2ML IJ SOLN: 4.0000 mg | Freq: Four times a day (QID) | INTRAMUSCULAR | Status: DC | PRN

## 2023-08-09 MED ORDER — DEXAMETHASONE SODIUM PHOSPHATE 10 MG/ML IJ SOLN
INTRAMUSCULAR | Status: AC
  Filled 2023-08-09: qty 1

## 2023-08-09 MED ORDER — OXYCODONE HCL 5 MG PO TABS: 5.0000 mg | ORAL_TABLET | ORAL | 0 refills | Status: AC | PRN

## 2023-08-09 MED ORDER — KETOROLAC TROMETHAMINE 15 MG/ML IJ SOLN
INTRAMUSCULAR | Status: AC
  Filled 2023-08-09: qty 1

## 2023-08-09 MED ORDER — KETOROLAC TROMETHAMINE 30 MG/ML IJ SOLN
INTRAMUSCULAR | Status: DC | PRN
  Administered 2023-08-09: 15 mg via INTRAMUSCULAR

## 2023-08-09 MED ORDER — ONDANSETRON HCL 4 MG/2ML IJ SOLN
INTRAMUSCULAR | Status: DC | PRN
  Administered 2023-08-09: 2 mg via INTRAVENOUS

## 2023-08-09 MED ORDER — CEFAZOLIN SODIUM-DEXTROSE 2-4 GM/100ML-% IV SOLN
2.0000 g | INTRAVENOUS | Status: AC
  Administered 2023-08-09: 2 g via INTRAVENOUS

## 2023-08-09 MED ORDER — CHLORHEXIDINE GLUCONATE 4 % EX SOLN
1.0000 | CUTANEOUS | 1 refills | Status: AC
Start: 2023-08-09 — End: ?

## 2023-08-09 MED ORDER — METOCLOPRAMIDE HCL 10 MG PO TABS: 5.0000 mg | ORAL_TABLET | Freq: Three times a day (TID) | ORAL | Status: DC | PRN

## 2023-08-09 MED ORDER — TRIAMCINOLONE ACETONIDE 40 MG/ML IJ SUSP
INTRAMUSCULAR | Status: DC | PRN
  Administered 2023-08-09: 80 mg via INTRAMUSCULAR

## 2023-08-09 MED ORDER — MIDAZOLAM HCL 2 MG/2ML IJ SOLN
INTRAMUSCULAR | Status: DC | PRN
  Administered 2023-08-09: 2 mg via INTRAVENOUS

## 2023-08-09 MED ORDER — PROPOFOL 1000 MG/100ML IV EMUL
INTRAVENOUS | Status: AC
  Filled 2023-08-09: qty 100

## 2023-08-09 MED ORDER — TRANEXAMIC ACID-NACL 1000-0.7 MG/100ML-% IV SOLN
1000.0000 mg | INTRAVENOUS | Status: AC
  Administered 2023-08-09: 1000 mg via INTRAVENOUS

## 2023-08-09 MED ORDER — OXYCODONE HCL 5 MG PO TABS
5.0000 mg | ORAL_TABLET | ORAL | Status: DC | PRN
  Administered 2023-08-09: 5 mg via ORAL

## 2023-08-09 MED ORDER — SODIUM CHLORIDE 0.9 % IV SOLN: INTRAVENOUS | Status: DC

## 2023-08-09 SURGICAL SUPPLY — 60 items
BLADE SAW 90X13X1.19 OSCILLAT (BLADE) ×1 IMPLANT
BLADE SAW SAG 25X90X1.19 (BLADE) ×1 IMPLANT
BLADE SURG SZ20 CARB STEEL (BLADE) ×1 IMPLANT
BNDG ELASTIC 6X5.8 VLCR NS LF (GAUZE/BANDAGES/DRESSINGS) ×1 IMPLANT
CEMENT BONE R 1X40 (Cement) ×2 IMPLANT
CEMENT VACUUM MIXING SYSTEM (MISCELLANEOUS) ×1 IMPLANT
CHLORAPREP W/TINT 26 (MISCELLANEOUS) ×1 IMPLANT
COMP FEM CR CEMT STD SZ5 (Joint) ×1 IMPLANT
COMP PATELLAR PEGX3 28X6.2 (Knees) ×1 IMPLANT
COMP TIB CMT PS D 0D RT (Joint) ×1 IMPLANT
COMPONENT FEM CR CEMT STD SZ5 (Joint) IMPLANT
COMPONENT PTLLAR PEGX3 28X6.2 (Knees) IMPLANT
COMPONENT TIB CMT PS D 0D RT (Joint) IMPLANT
COOLER POLAR GLACIER W/PUMP (MISCELLANEOUS) ×1 IMPLANT
COVER MAYO STAND STRL (DRAPES) ×1 IMPLANT
CUFF TRNQT CYL 24X4X16.5-23 (TOURNIQUET CUFF) IMPLANT
CUFF TRNQT CYL 34X4.125X (TOURNIQUET CUFF) IMPLANT
DRAPE IMP U-DRAPE 54X76 (DRAPES) ×1 IMPLANT
DRAPE SHEET LG 3/4 BI-LAMINATE (DRAPES) ×1 IMPLANT
DRAPE U-SHAPE 47X51 STRL (DRAPES) ×1 IMPLANT
DRSG MEPILEX SACRM 8.7X9.8 (GAUZE/BANDAGES/DRESSINGS) IMPLANT
DRSG OPSITE POSTOP 4X10 (GAUZE/BANDAGES/DRESSINGS) ×1 IMPLANT
DRSG OPSITE POSTOP 4X6 (GAUZE/BANDAGES/DRESSINGS) IMPLANT
DRSG OPSITE POSTOP 4X8 (GAUZE/BANDAGES/DRESSINGS) ×1 IMPLANT
ELECT CAUTERY BLADE 6.4 (BLADE) ×1 IMPLANT
ELECT REM PT RETURN 9FT ADLT (ELECTROSURGICAL) ×1 IMPLANT
ELECTRODE REM PT RTRN 9FT ADLT (ELECTROSURGICAL) ×1 IMPLANT
GAUZE XEROFORM 1X8 LF (GAUZE/BANDAGES/DRESSINGS) ×1 IMPLANT
GLOVE BIO SURGEON STRL SZ7.5 (GLOVE) ×4 IMPLANT
GLOVE BIO SURGEON STRL SZ8 (GLOVE) ×4 IMPLANT
GLOVE BIOGEL PI IND STRL 8 (GLOVE) ×1 IMPLANT
GLOVE INDICATOR 8.0 STRL GRN (GLOVE) ×1 IMPLANT
GOWN STRL REUS W/ TWL LRG LVL3 (GOWN DISPOSABLE) ×1 IMPLANT
GOWN STRL REUS W/ TWL XL LVL3 (GOWN DISPOSABLE) ×1 IMPLANT
HOOD PEEL AWAY T7 (MISCELLANEOUS) ×3 IMPLANT
INSERT TIB ASF 11 4-5/CD RT (Insert) IMPLANT
KIT TURNOVER KIT A (KITS) ×1 IMPLANT
MANIFOLD NEPTUNE II (INSTRUMENTS) ×1 IMPLANT
NDL SPNL 20GX3.5 QUINCKE YW (NEEDLE) ×1 IMPLANT
NEEDLE SPNL 20GX3.5 QUINCKE YW (NEEDLE) ×1 IMPLANT
NS IRRIG 500ML POUR BTL (IV SOLUTION) ×1 IMPLANT
PACK TOTAL KNEE (MISCELLANEOUS) ×1 IMPLANT
PAD WRAPON POLAR KNEE (MISCELLANEOUS) ×1 IMPLANT
PENCIL SMOKE EVACUATOR (MISCELLANEOUS) ×1 IMPLANT
PIN DRILL HDLS TROCAR 75 4PK (PIN) IMPLANT
PULSAVAC PLUS IRRIG FAN TIP (DISPOSABLE) ×1 IMPLANT
SCREW FEMALE HEX FIX 25X2.5 (ORTHOPEDIC DISPOSABLE SUPPLIES) IMPLANT
SOL .9 NS 3000ML IRR UROMATIC (IV SOLUTION) ×1 IMPLANT
STAPLER SKIN PROX 35W (STAPLE) ×1 IMPLANT
STOCKINETTE IMPERV 14X48 (MISCELLANEOUS) ×1 IMPLANT
SUCTION TUBE FRAZIER 10FR DISP (SUCTIONS) ×1 IMPLANT
SUT VIC AB 0 CT1 36 (SUTURE) ×3 IMPLANT
SUT VIC AB 2-0 CT1 TAPERPNT 27 (SUTURE) ×3 IMPLANT
SYR 10ML LL (SYRINGE) ×1 IMPLANT
SYR 20ML LL LF (SYRINGE) ×1 IMPLANT
SYR 30ML LL (SYRINGE) IMPLANT
TIP FAN IRRIG PULSAVAC PLUS (DISPOSABLE) ×1 IMPLANT
TRAP FLUID SMOKE EVACUATOR (MISCELLANEOUS) ×1 IMPLANT
WATER STERILE IRR 500ML POUR (IV SOLUTION) ×1 IMPLANT
WRAPON POLAR PAD KNEE (MISCELLANEOUS) ×1 IMPLANT

## 2023-08-09 NOTE — Anesthesia Preprocedure Evaluation (Signed)
 Anesthesia Evaluation  Patient identified by MRN, date of birth, ID band Patient awake    Reviewed: Allergy & Precautions, NPO status , Patient's Chart, lab work & pertinent test results  History of Anesthesia Complications Negative for: history of anesthetic complications  Airway Mallampati: II  TM Distance: >3 FB Neck ROM: Full    Dental no notable dental hx. (+) Teeth Intact   Pulmonary neg pulmonary ROS, neg sleep apnea, neg COPD, Patient abstained from smoking.Not current smoker   Pulmonary exam normal breath sounds clear to auscultation       Cardiovascular Exercise Tolerance: Good METShypertension, Pt. on medications (-) CAD and (-) Past MI (-) dysrhythmias  Rhythm:Regular Rate:Normal - Systolic murmurs    Neuro/Psych  PSYCHIATRIC DISORDERS Anxiety     negative neurological ROS     GI/Hepatic ,neg GERD  ,,(+)     (-) substance abuse    Endo/Other  neg diabetes    Renal/GU CRFRenal disease     Musculoskeletal   Abdominal   Peds  Hematology Denies blood thinner use or bleeding disorders.    Anesthesia Other Findings Past Medical History: No date: Anxiety No date: Hyperlipidemia No date: Hypertension 10/06/2022: IGT (impaired glucose tolerance) No date: Renal disorder  Reproductive/Obstetrics                             Anesthesia Physical Anesthesia Plan  ASA: 2  Anesthesia Plan: Spinal   Post-op Pain Management: Ofirmev IV (intra-op)*   Induction: Intravenous  PONV Risk Score and Plan: 2 and Ondansetron, Dexamethasone, Propofol infusion, TIVA and Midazolam  Airway Management Planned: Natural Airway  Additional Equipment: None  Intra-op Plan:   Post-operative Plan:   Informed Consent: I have reviewed the patients History and Physical, chart, labs and discussed the procedure including the risks, benefits and alternatives for the proposed anesthesia with the  patient or authorized representative who has indicated his/her understanding and acceptance.       Plan Discussed with: CRNA and Surgeon  Anesthesia Plan Comments: (Discussed R/B/A of neuraxial anesthesia technique with patient: - rare risks of spinal/epidural hematoma, nerve damage, infection - Risk of PDPH - Risk of nausea and vomiting - Risk of conversion to general anesthesia and its associated risks, including sore throat, damage to lips/eyes/teeth/oropharynx, and rare risks such as cardiac and respiratory events. - Risk of allergic reactions  Discussed the role of CRNA in patient's perioperative care.  Patient voiced understanding.)       Anesthesia Quick Evaluation

## 2023-08-09 NOTE — Discharge Instructions (Addendum)
 Orthopedic discharge instructions: May shower with intact OpSite dressing. Apply ice frequently to knee or use Polar Care. Start Eliquis 1 tablet (2.5 mg) twice daily on Wednesday, 08/10/2023, for 2 weeks, then take aspirin 325 mg twice daily for 4 weeks. Take pain medication as prescribed when needed.  May supplement with ES Tylenol if necessary. May weight-bear as tolerated on right leg - use walker for balance and support. Follow-up in 10-14 days or as scheduled.

## 2023-08-09 NOTE — Transfer of Care (Signed)
 Immediate Anesthesia Transfer of Care Note  Patient: Katelyn Gardner  Procedure(s) Performed: ARTHROPLASTY, KNEE, TOTAL (Right: Knee)  Patient Location: PACU  Anesthesia Type:General and Spinal  Level of Consciousness: drowsy and patient cooperative  Airway & Oxygen Therapy: Patient Spontanous Breathing and Patient connected to face mask oxygen  Post-op Assessment: Report given to RN and Post -op Vital signs reviewed and stable  Post vital signs: Reviewed and stable  Last Vitals:  Vitals Value Taken Time  BP 115/73 08/09/23 1253  Temp    Pulse 87 08/09/23 1254  Resp 16 08/09/23 1254  SpO2 100 % 08/09/23 1254  Vitals shown include unfiled device data.  Last Pain:  Vitals:   08/09/23 0834  TempSrc: Temporal  PainSc: 4          Complications: No notable events documented.

## 2023-08-09 NOTE — Anesthesia Procedure Notes (Addendum)
 Spinal  Patient location during procedure: OR Reason for block: surgical anesthesia Staffing Performed: anesthesiologist and other anesthesia staff  Anesthesiologist: Corinda Gubler, MD Other anesthesia staff: Chari Manning, RN Performed by: Corinda Gubler, MD Authorized by: Corinda Gubler, MD   Preanesthetic Checklist Completed: patient identified, IV checked, site marked, risks and benefits discussed, surgical consent, monitors and equipment checked, pre-op evaluation and timeout performed Spinal Block Patient position: sitting Prep: ChloraPrep and site prepped and draped Patient monitoring: heart rate, continuous pulse ox, blood pressure and cardiac monitor Approach: midline Location: L2-3 Injection technique: single-shot Needle Needle type: Quincke  Needle gauge: 22 G Needle length: 9 cm Assessment Sensory level: T10 Events: CSF return Additional Notes Unsuccessful attempt with 25g Whitacre by SRNA. Subsequent attempt by MD utilizing 22g Quincke, while challenging and requiring two skin entry points, was successful. Meticulous sterile technique used throughout (CHG prep, sterile gloves, sterile drape). Negative paresthesia. Negative blood return. Positive free-flowing CSF. Expiration date of kit checked and confirmed. Patient tolerated procedure well, without complications.

## 2023-08-09 NOTE — Op Note (Signed)
 08/09/2023  12:46 PM  Patient:   Katelyn Gardner  Pre-Op Diagnosis:   Degenerative joint disease, right knee.  Post-Op Diagnosis:   Same  Procedure:   Right TKA using all-cemented Zimmer Persona system with a #5 PCR femur, a(n) D-sized  tibial tray with an 11 mm medial congruent E-poly insert, and a 28 x 6.2 mm Vanguard all-poly 3-pegged domed patella.  Surgeon:   Maryagnes Amos, MD  Assistant:   Horris Latino, PA-C; Martie Round, PA-S  Anesthesia:   Spinal  Findings:   As above  Complications:   None  EBL:   10 cc  Fluids:   300 cc crystalloid  UOP:   None  TT:   90 minutes at 300 mmHg  Drains:   None  Closure:   Staples  Implants:   As above  Brief Clinical Note:   The patient is a 63 year old female with a long history of progressively worsening right knee pain. The patient's symptoms have progressed despite medications, activity modification, injections, etc. The patient's history and examination were consistent with advanced degenerative joint disease of the right knee confirmed by plain radiographs. The patient presents at this time for a right total knee arthroplasty.  Procedure:   The patient was brought into the operating room. After adequate spinal anesthesia was obtained, the patient was repositioned in the supine position on the operating room table. The right lower extremity was prepped with ChloraPrep solution and draped sterilely. Preoperative antibiotics were administered. A timeout was performed to verify the appropriate surgical site before the limb was exsanguinated with an Esmarch and the tourniquet inflated to 300 mmHg.   A standard anterior approach to the knee was made through an approximately 6-7 inch incision. The incision was carried down through the subcutaneous tissues to expose superficial retinaculum. This was split the length of the incision and the medial flap elevated sufficiently to expose the medial retinaculum. The medial retinaculum was  incised, leaving a 3-4 mm cuff of tissue on the patella. This was extended distally along the medial border of the patellar tendon and proximally through the medial third of the quadriceps tendon. A subtotal fat pad excision was performed before the soft tissues were elevated off the anteromedial and anterolateral aspects of the proximal tibia to the level of the collateral ligaments. The anterior portions of the medial and lateral menisci were removed, as was the anterior cruciate ligament. With the knee flexed to 90, the external tibial guide was positioned and the appropriate proximal tibial cut made. This piece was taken to the back table where it was measured and found to be optimally replicated by a(n) D-sized component.  Attention was directed to the distal femur. The intramedullary canal was accessed through a 3/8" drill hole. The intramedullary guide was inserted and placed at 5 of valgus alignment. Using the +0 slot, the distal cut was made. The distal femur was measured and found to be optimally replicated by the #5 component. The #5 4-in-1 cutting block was positioned and first the posterior, then the posterior chamfer, the anterior, and finally the anterior chamfer cuts were made after verifying that the anterior cortex would not be notched.   At this point, the posterior portions medial and lateral menisci were removed. A trial reduction was performed using the appropriate femoral and tibial components with the  first the 10 mm and then the 11 mm  insert. The 11 mm insert demonstrated excellent stability to varus and valgus stressing both in flexion  and extension while permitting full extension. Patellar tracking was assessed and found to be excellent. Therefore, the tibial trial position was marked on the proximal tibia. The patella thickness was measured and found to be 18 mm. Therefore, the appropriate cut was made. The patellar surface was measured and found to be optimally replicated by the   28  mm Vanguard trial component. The three peg holes were drilled in place before the trial button was inserted. Patella tracking was assessed and found to be excellent, passing the "no thumb test". The lug holes were drilled into the distal femur before the trial component was removed.  The tibial tray was repositioned before the keel was created using the appropriate tower, reamer, and punch.  The bony surfaces were prepared for cementing by irrigating them thoroughly with sterile saline solution via the jet lavage system. A bone plug was fashioned from some of the bone that had been removed previously and used to plug the distal femoral canal. In addition, a "cocktail" of 20 cc of Exparel, 30 cc of 0.5% Sensorcaine, 2 cc of Kenalog 40 (80 mg), and 30 mg of Toradol diluted out to 90 cc with normal saline was injected into the postero-medial and postero-lateral aspects of the knee, the medial and lateral gutter regions, and the peri-incisional tissues to help with postoperative analgesia. Meanwhile, the cement was being mixed on the back table.   When the cement was ready, the tibial tray was cemented in first. The excess cement was removed using Personal assistant. Next, the femoral component was impacted into place. Again, the excess cement was removed using Personal assistant. The 11 mm trial insert was positioned and the knee brought into extension while the cement hardened. Finally, the patella was cemented into place and secured using the patellar clamp. Again, the excess cement was removed using Personal assistant. Once the cement had hardened, the knee was placed through a range of motion with the findings as described above. Therefore, the trial insert was removed and, after verifying that no cement had been retained posteriorly, the permanent 11 mm medial congruent E-polyethylene insert was snapped into place with care taken to ensure appropriate locking of the insert. Again the knee was placed through a range  of motion with the findings as described above.  The wound was copiously irrigated with sterile saline solution using the jet lavage system before the quadriceps tendon and retinacular layer were reapproximated using #0 Vicryl interrupted sutures. The superficial retinacular layer also was closed using a running #0 Vicryl suture. The subcutaneous tissues were closed in several layers using 2-0 Vicryl interrupted sutures. The skin was closed using staples. A sterile honeycomb dressing was applied to the skin before the leg was wrapped with an Ace wrap to accommodate the Polar Care device. The patient was then awakened and returned to the recovery room in satisfactory condition after tolerating the procedure well.

## 2023-08-09 NOTE — TOC Initial Note (Signed)
 Transition of Care [facility]) - Initial/Assessment Note    Patient Details  Name: Katelyn Gardner MRN: [id number] Date of Birth: 1960-07-06  Transition of Care [facility]) CM/SW Contact:    Marlowe Sax, RN Phone Number: 08/09/2023, 10:44 AM  Clinical Narrative:                  The patient is set up with [facility] prior to Surgery by surgeons office RW and 3 in 1 will be delivered by [employer]   Expected Discharge Plan: Home w Home Health Services Barriers to Discharge: No Barriers Identified   Patient Goals and CMS Choice            Expected Discharge Plan and Services   Discharge Planning Services: CM Consult   Living arrangements for the past 2 months: Single Family Home                 DME Arranged: Walker rolling, 3-N-1 DME Agency: [facility] Date DME Agency Contacted: 08/09/23 Time DME Agency Contacted: 75 Representative spoke with at DME Agency: Clet[facility] [place] HH Arranged: PT HH Agency: [facility] Date [facility] Agency Contacted: 08/09/23 Time HH Agency Contacted: 1044 [facility] Agency: [place]  Prior Living Arrangements/Services Living arrangements for the past 2 months: Single Family Home   Patient language and need for interpreter reviewed:: Yes Do you feel safe going back to the place where you live?: Yes      Need for Family Participation in Patient Care: Yes (Comment) Care giver support system in place?: Yes (comment)   Criminal Activity/Legal Involvement Pertinent to Current Situation/Hospitalization: No - Comment as needed  Activities of Daily Living      Permission Sought/Granted   Permission granted to share information with : Yes, Verbal Permission Granted              Emotional Assessment Appearance:: Appears stated age Attitude/Demeanor/Rapport: Engaged Affect (typically observed): Appropriate Orientation: : Oriented to Self, Oriented to Place, Oriented to  Time, Oriented to Situation Alcohol / Substance Use: Not  Applicable Psych Involvement: No (comment)  Admission diagnosis:  Primary osteoarthritis of right knee [M17.11] Complex tear of medial meniscus of right knee as current injury, initial encounter [S83.231A] Patient Active Problem List   Diagnosis Date Noted   Iron deficiency anemia 03/08/2023   Chronic kidney disease (CKD), stage III (moderate) (HCC) 11/02/2022   Normocytic anemia 11/02/2022   Hypertension 11/02/2022   PCP:  Myrene Buddy, NP Pharmacy:   [facility] - [place], [place] - [address] [place] [address] Phone: [phone] Fax: [phone]     Social Drivers of Health (SDOH) Social History: SDOH Screenings   Food Insecurity: No Food Insecurity (10/29/2022)  Housing: Low Risk  (06/10/2023)   Received from [facility]  Transportation Needs: No Transportation Needs (10/29/2022)  Utilities: Not At Risk (10/29/2022)  Depression (PHQ2-9): Low Risk  (10/29/2022)  Financial Resource Strain: Low Risk  (10/06/2022)   Received from [facility]  Tobacco Use: Low Risk  (08/09/2023)   SDOH Interventions:     Readmission Risk Interventions     No data to display

## 2023-08-09 NOTE — H&P (Signed)
 History of Present Illness: Katelyn Gardner is a 63 y.o.female with a several year history of bilateral knee pain secondary to degenerative joint disease. However, her right knee symptoms worsened about 9 months ago as a result of a twisting injury while she and her husband were at a wedding . She felt a pop at that time. Since then, she has had increased pain with any standing or ambulation. She saw Dedra Skeens, PA-C, in October and again in January. She received steroid injections into her right knee on both occasions. However, because of continued symptoms, she was sent for an MRI scan and referred to me for further evaluation and treatment. She reports 8/10 pain. The pain is located along the lateral and medial aspect of the knee. The pain is described as aching, dull, stabbing, and throbbing. The symptoms are aggravated with normal daily activities, with sleeping, using stairs, at higher levels of activity, rising from a chair, walking, standing, standing pivot, and activity in general. She also describes occasional episodes of catching or giving way. She has associated mild swelling and deformity. She has tried acetaminophen, over-the-counter medications, narcotic medications, bracing, steroid injections, and ice with limited benefit.  Current Outpatient Medications:  atorvastatin (LIPITOR) 10 MG tablet Take 1 tablet (10 mg total) by mouth once daily For cholesterol 90 tablet 1  butalbital-aspirin-caffeine-codeine (ASCOMP WITH CODEINE) capsule Take 1-2 capsules by mouth every 6 (six) hours as needed for Pain 30 capsule 0  clonazePAM (KLONOPIN) 0.5 MG tablet Take 0.5 mg by mouth 3 (three) times daily 1 in am, 1 in pm, 1 at bedtime 1  cyclobenzaprine (FLEXERIL) 5 MG tablet Take 1-2 tablets (5-10 mg total) by mouth 3 (three) times daily as needed for Muscle spasms 180 tablet 3  enalapril (VASOTEC) 5 MG tablet Take 5 mg by mouth once daily  FLUoxetine (PROZAC) 40 MG capsule Take 40 mg by mouth once daily.   hydroCHLOROthiazide (HYDRODIURIL) 12.5 MG tablet Take 1 tablet by mouth once daily  HYDROcodone-acetaminophen (NORCO) 5-325 mg tablet Take 1 tablet by mouth every 6 (six) hours as needed for Pain 90 tablet 0  propranoloL (INDERAL) 20 MG tablet Take 10 mg by mouth once daily  zolpidem (AMBIEN) 10 mg tablet Take 10 mg by mouth nightly as needed.   Allergies: Augmentin [Amoxicillin-Pot Clavulanate] Diarrhea  Carvedilol Diarrhea  Losartan Dizziness and Headache  Sulfa (Sulfonamide Antibiotics) Rash  Amoxil [Amoxicillin] Unknown  Elavil [Amitriptyline] Unknown  Imitrex [Sumatriptan] Unknown  Inderal [Propranolol] Unknown  Plendil [Felodipine] Unknown  Trazodone Unknown  Ultram [Tramadol] Unknown  Aldactazide [Spironolacton-Hydrochlorothiaz] Other ("felt funny")  Motrin [Ibuprofen] Other (Abdominal pain)  Niaspan Extended-Release [Niacin] Other (Joint pain)  Pravastatin Other ("felt funny")  Simvastatin Other ("felt funny")  Zestril [Lisinopril] Dizziness   Past Medical History:  Allergic rhinitis  Anxiety 12/20/2013 (Dr. Imogene Burn)  Chronic back pain 12/20/2013  Chronic insomnia  Chronic kidney disease, stage III (moderate) (CMS/HHS-HCC) 12/20/2013  COVID-19 05/2021 (Not tx'd w/ anything)  Essential hypertension, benign 12/20/2013  History of nonadherence to medical treatment  to follow-up LAB APPTS  Hyperlipidemia  IGT (impaired glucose tolerance) 10/06/2022 (A1c 6%)  Migraines 12/20/2013  Proteinuria 02/05/2019   Past Surgical History:  LAPAROSCOPY DIAGNOSTIC 10/93  WITH INFERTILITY W/U  NOVASURE ABLATION 12/2009   Family History:  Heart disease Mother  Emphysema Father  Lung cancer Father  Colonic polyp Father  Migraines Other   Social History:   Socioeconomic History:  Marital status: Married  Spouse name: Viviann Spare  Tobacco Use  Smoking status:  Never  Smokeless tobacco: Never  Vaping Use  Vaping status: Never Used  Substance and Sexual Activity  Alcohol use: No   Alcohol/week: 0.0 standard drinks of alcohol  Drug use: No  Sexual activity: Not Currently  Partners: Male  Birth control/protection: None   Social Drivers of Health:   Physicist, medical Strain: Low Risk (10/06/2022)  Overall Financial Resource Strain (CARDIA)  Difficulty of Paying Living Expenses: Not very hard  Food Insecurity: No Food Insecurity (10/29/2022)  Received from [facility]  Hunger Vital Sign  Worried About Running Out of Food in the Last Year: Never true  Ran Out of Food in the Last Year: Never true  Transportation Needs: No Transportation Needs (10/29/2022)  Received from [facility] - [facility]  Lack of Transportation (Medical): No  Lack of Transportation (Non-Medical): No   Review of Systems:  A comprehensive 14 point ROS was performed, reviewed, and the pertinent orthopaedic findings are documented in the HPI.  Physical Exam: Vitals:  06/10/23 1246  BP: 134/84  Weight: 62.8 kg (138 lb 6.4 oz)  Height: 149.9 cm (4\' 11" )  PainSc: 8  PainLoc: Knee   General/Constitutional: The patient appears to be well-nourished, well-developed, and in no acute distress. Neuro/Psych: Normal mood and affect, oriented to person, place and time. Eyes: Non-icteric. Pupils are equal, round, and reactive to light, and exhibit synchronous movement. Lymphatic: No palpable adenopathy. Respiratory: No wheezes and Non-labored breathing Cardiovascular: No edema, swelling or tenderness, except as noted in detailed exam. Vascular: No edema, swelling or tenderness, except as noted in detailed exam. Integumentary: No impressive skin lesions present, except as noted in detailed exam. Musculoskeletal: Unremarkable, except as noted in detailed exam.  Right knee exam: GAIT: moderate limp favoring her right knee and uses no assistive devices. ALIGNMENT: mild varus SKIN: unremarkable SWELLING: minimal EFFUSION: trace WARMTH: no warmth TENDERNESS: moderate tenderness along  the medial joint line, mild tenderness along the lateral joint line and in the peripatellar region ROM: 0 to 130 degrees with mild pain in maximal flexion McMURRAY'S: equivocal PATELLOFEMORAL: normal tracking with no peri-patellar tenderness and negative apprehension sign CREPITUS: Mild patellofemoral crepitance LACHMAN'S: negative PIVOT SHIFT: Not evaluated ANTERIOR DRAWER: negative POSTERIOR DRAWER: negative VARUS/VALGUS: Mild positive pseudolaxity to varus stressing  She is neurovascularly intact to the right lower extremity and foot.  Imaging: Recent AP and lateral weightbearing of the right knee, as well as merchant views of both knees are available for review and have been reviewed by myself. These films demonstrate moderate-severe degenerative changes, primarily involving the medial compartment with 90% medial joint space narrowing. Overall alignment is mild varus. No fractures, lytic lesions, or abnormal calcifications are noted.  Knee Imaging, external: Right knee: A recent MRI scan of the right knee is available for review and has been reviewed by myself. By report, the study demonstrates evidence of a "complex tear of the posterior horn of the medial meniscus with peripheral meniscal extrusion and degeneration of the body." It also demonstrates evidence of "full-thickness cartilage loss of the medial patellar facet with subchondral marrow edema. Moderate partial-thickness cartilage loss of the medial trochlea", as well as "full-thickness cartilage loss of the weightbearing medial femoral-tibial compartment with subchondral marrow edema." There is evidence of a "small radial tear of the posterior horn of the lateral meniscus" as well. Both the films and report were reviewed by myself and discussed with the patient.  Assessment:  1. Primary osteoarthritis of right knee.  2. Complex tear of medial meniscus of  right knee.  Plan: The treatment options were discussed with the patient. In  addition, patient educational materials were provided regarding the diagnosis and treatment options. The patient is quite frustrated by her symptoms and function limitations, and is ready to consider more aggressive treatment options. Therefore, I have recommended a surgical procedure, specifically a right total knee arthroplasty. The procedure was discussed with the patient, as were the potential risks (including bleeding, infection, nerve and/or blood vessel injury, persistent or recurrent pain, loosening and/or failure of the components, dislocation, need for further surgery, blood clots, strokes, heart attacks and/or arhythmias, pneumonia, etc.) and benefits. The patient states his/her understanding and wishes to proceed. All of the patient's questions and concerns were answered. She can call any time with further concerns. She will follow up post-surgery, routine.    H&P reviewed and patient re-examined. No changes.

## 2023-08-09 NOTE — Evaluation (Signed)
 Physical Therapy Evaluation Patient Details Name: Katelyn Gardner MRN: [id number] DOB: October 16, 1960 Today's Date: 08/09/2023  History of Present Illness  Pt is a 63 yo female s/p R TKA. PMH of smoker, HTN, anxiety.  Clinical Impression  Pt alert, agreeable to PT, denied pain. At baseline the pt is independent and will have family to assist as needed at discharge. Pt reported ongoing numbness/weakness/proprioceptive issues with RLE throughout mobility, but able to progress and mobilize. Supine to sit modI, CGA-supervision with RW for transfers, verbal cues for hand placement with good follow through. She was able to ambulate ~276ft with RW and CGA, cues for TKE and step to gait pattern. Progressed to improved weight bearing in RLE and almost step through with time. Stair navigation with CGA and tactile cues for TKE for safety, pt able to demonstrate and verbalized safe technique (provided with written instruction as well).  Overall the patient demonstrated deficits (see "PT Problem List") that impede the patient's functional abilities, safety, and mobility and would benefit from skilled PT intervention. Care team updated on pt status, may benefit from ambulation later in PM prior to discharge to ensure safety.           If plan is discharge home, recommend the following: A little help with bathing/dressing/bathroom;Assistance with cooking/housework;Assist for transportation;Help with stairs or ramp for entrance   Can travel by private vehicle        Equipment Recommendations Rolling walker (2 wheels);BSC/3in1  Recommendations for Other Services       Functional Status Assessment Patient has had a recent decline in their functional status and demonstrates the ability to make significant improvements in function in a reasonable and predictable amount of time.     Precautions / Restrictions Precautions Precautions: Fall;Knee Precaution Booklet Issued: Yes (comment) Recall of  Precautions/Restrictions: Intact Restrictions Weight Bearing Restrictions Per Provider Order: Yes RLE Weight Bearing Per Provider Order: Weight bearing as tolerated      Mobility  Bed Mobility Overal bed mobility: Modified Independent                  Transfers Overall transfer level: Needs assistance Equipment used: Rolling walker (2 wheels) Transfers: Sit to/from Stand Sit to Stand: Supervision           General transfer comment: verbal cues for safe hand placement    Ambulation/Gait Ambulation/Gait assistance: Contact guard assist Gait Distance (Feet): 200 Feet Assistive device: Rolling walker (2 wheels)         General Gait Details: step to gait pattern first, able to improve to near step through, pt still having difficulty with sensation/R leg proprioception  Stairs Stairs: Yes Stairs assistance: Contact guard assist Stair Management: Two rails, Step to pattern Number of Stairs: 4 General stair comments: tactile cues for TKE for descent to ensure safety, verbal cues for correc ttechnique, provided with writtent instructions  Wheelchair Mobility     Tilt Bed    Modified Rankin (Stroke Patients Only)       Balance Overall balance assessment: Needs assistance Sitting-balance support: Feet supported Sitting balance-Leahy Scale: Good     Standing balance support: Bilateral upper extremity supported Standing balance-Leahy Scale: Fair                               Pertinent Vitals/Pain Pain Assessment Pain Assessment: No/denies pain    Home Living Family/patient expects to be discharged to:: Private residence Living Arrangements: Spouse/significant other;Children Available  Help at Discharge: Family Type of Home: House Home Access: Stairs to enter Entrance Stairs-Rails: Can reach both Entrance Stairs-Number of Steps: 4   Home Layout: Two level;1/2 bath on main level Home Equipment: None      Prior Function Prior Level of  Function : Independent/Modified Independent                     Extremity/Trunk Assessment   Upper Extremity Assessment Upper Extremity Assessment: Overall WFL for tasks assessed    Lower Extremity Assessment Lower Extremity Assessment:  (LLE WFLs, pt reported ongoing RLE numbness/lack of sensation)       Communication        Cognition Arousal: Alert Behavior During Therapy: WFL for tasks assessed/performed   PT - Cognitive impairments: No apparent impairments                         Following commands: Intact       Cueing       General Comments      Exercises Total Joint Exercises Ankle Circles/Pumps: AROM, Both, 10 reps Quad Sets: AROM, Right, 10 reps   Assessment/Plan    PT Assessment Patient needs continued PT services  PT Problem List Decreased strength;Decreased range of motion;Decreased knowledge of use of DME;Decreased activity tolerance;Decreased balance;Decreased knowledge of precautions;Decreased mobility;Pain       PT Treatment Interventions DME instruction;Neuromuscular re-education;Gait training;Stair training;Patient/family education;Functional mobility training;Therapeutic activities;Therapeutic exercise;Balance training    PT Goals (Current goals can be found in the Care Plan section)  Acute Rehab PT Goals Patient Stated Goal: to go home PT Goal Formulation: With patient Time For Goal Achievement: 08/23/23 Potential to Achieve Goals: Good    Frequency BID     Co-evaluation               AM-PAC PT "6 Clicks" Mobility  Outcome Measure Help needed turning from your back to your side while in a flat bed without using bedrails?: None Help needed moving from lying on your back to sitting on the side of a flat bed without using bedrails?: None Help needed moving to and from a bed to a chair (including a wheelchair)?: None Help needed standing up from a chair using your arms (e.g., wheelchair or bedside chair)?:  None Help needed to walk in hospital room?: A Little Help needed climbing 3-5 steps with a railing? : A Little 6 Click Score: 22    End of Session Equipment Utilized During Treatment: Gait belt Activity Tolerance: Patient tolerated treatment well Patient left: in chair;with family/visitor present   PT Visit Diagnosis: Other abnormalities of gait and mobility (R26.89);Difficulty in walking, not elsewhere classified (R26.2);Muscle weakness (generalized) (M62.81);Pain Pain - Right/Left: Right Pain - part of body: Knee    Time: 6045-4098 PT Time Calculation (min) (ACUTE ONLY): 30 min   Charges:   PT Evaluation $PT Eval Low Complexity: 1 Low PT Treatments $Gait Training: 8-22 mins $Therapeutic Activity: 8-22 mins PT General Charges $$ ACUTE PT VISIT: 1 Visit        Olga Coaster PT, DPT 3:51 PM,08/09/23

## 2023-08-09 NOTE — Progress Notes (Signed)
 This patient is not able to walk the distance required to go the bathroom, or he/she is unable to safely negotiate stairs required to access the bathroom.  A 3-in-1 BSC will alleviate this problem.

## 2023-08-10 ENCOUNTER — Encounter: Payer: Self-pay | Admitting: Surgery

## 2023-08-10 NOTE — Anesthesia Postprocedure Evaluation (Signed)
 Anesthesia Post Note  Patient: Katelyn Gardner  Procedure(s) Performed: ARTHROPLASTY, KNEE, TOTAL (Right: Knee)  Patient location during evaluation: PACU Anesthesia Type: Spinal Level of consciousness: oriented and awake and alert Pain management: pain level controlled Vital Signs Assessment: post-procedure vital signs reviewed and stable Respiratory status: spontaneous breathing, respiratory function stable and patient connected to nasal cannula oxygen Cardiovascular status: blood pressure returned to baseline and stable Postop Assessment: no headache, no backache and no apparent nausea or vomiting Anesthetic complications: no   No notable events documented.   Last Vitals:  Vitals:   08/09/23 1417 08/09/23 1608  BP: (!) 150/83 130/80  Pulse: 81 87  Resp: 18 18  Temp: (!) 36.3 C (!) 36.3 C  SpO2: 100% 100%    Last Pain:  Vitals:   08/09/23 1630  TempSrc:   PainSc: 4                  Corinda Gubler

## 2023-09-05 ENCOUNTER — Other Ambulatory Visit: Payer: 59

## 2023-09-05 ENCOUNTER — Ambulatory Visit: Payer: 59 | Admitting: Internal Medicine
